# Patient Record
Sex: Female | Born: 1995
Health system: Southern US, Community
[De-identification: ages and names within clinical notes are randomized; demographics above are authoritative.]

---
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
## 2014-02-23 IMAGING — CR DG CHEST 2V
2 series · 2 of 2 positions shown · non-contrast
Comparison: 10/06/2003.

CLINICAL DATA: Productive cough.

CHEST - 2 VIEW

[view not recorded (1 of 2)]
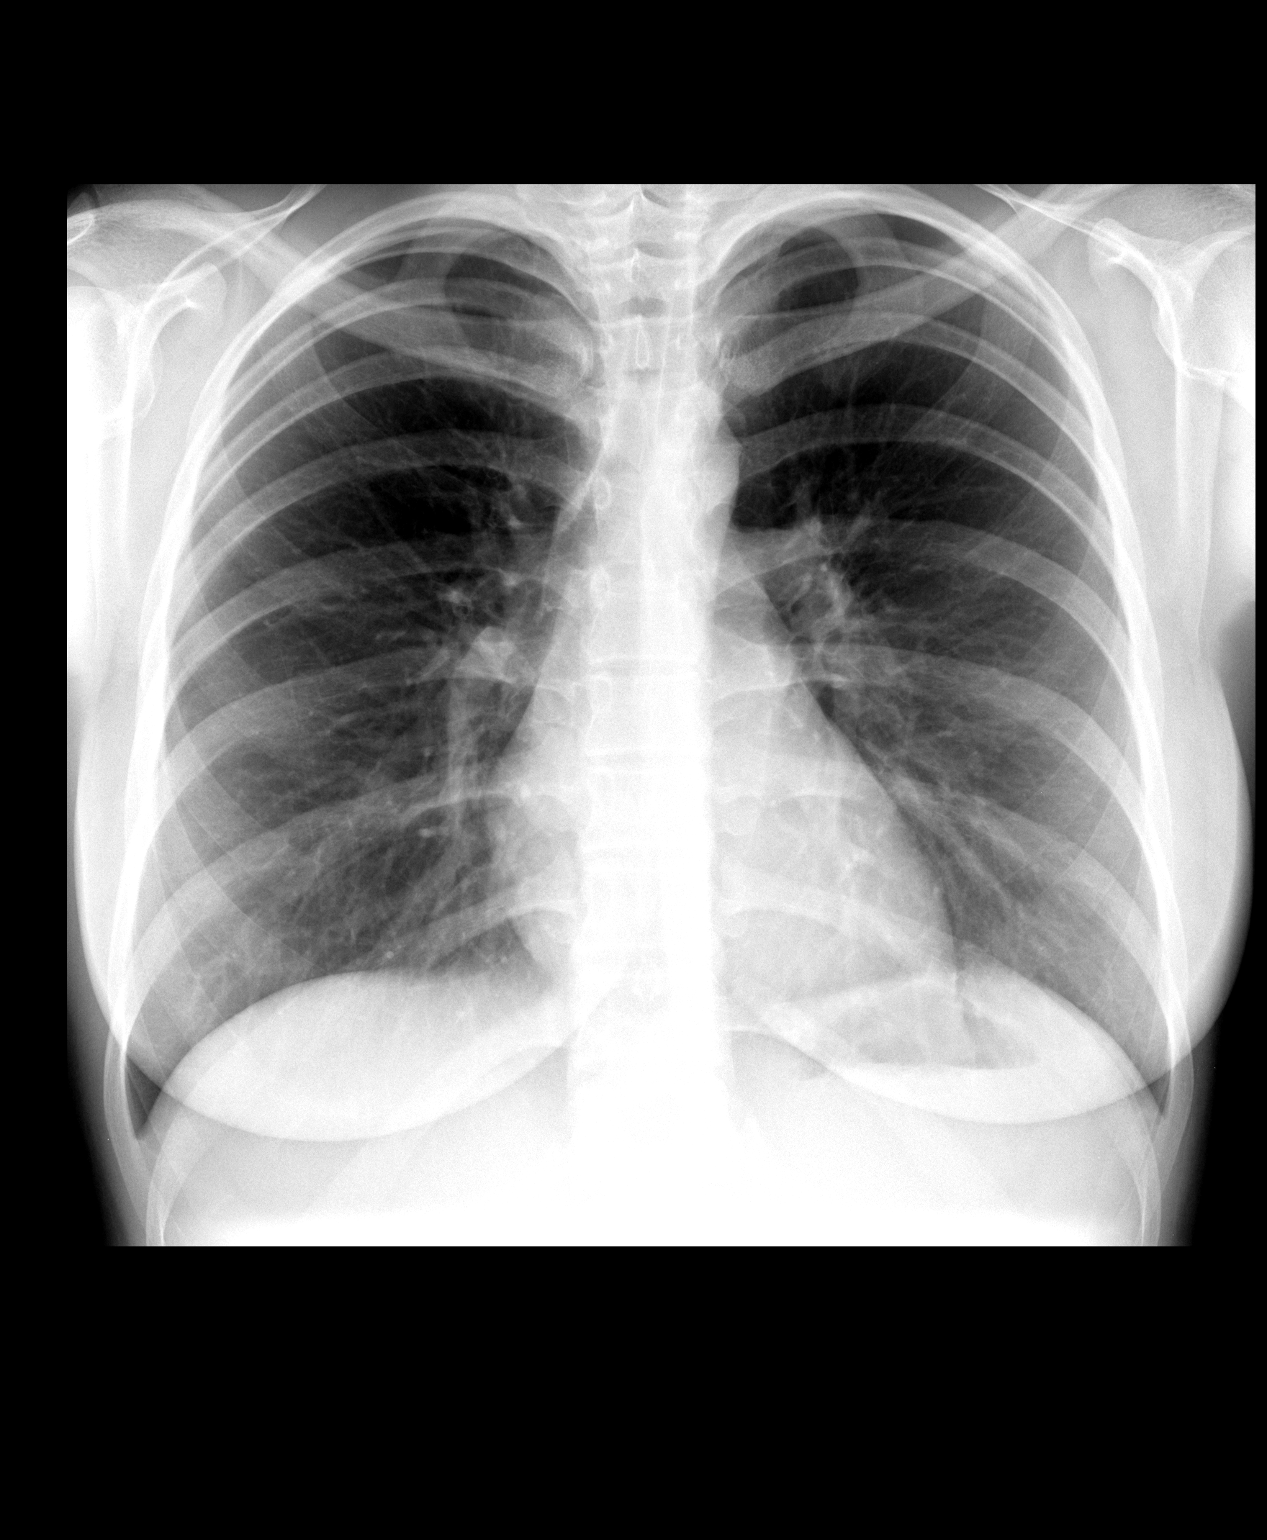

[view not recorded (2 of 2)]
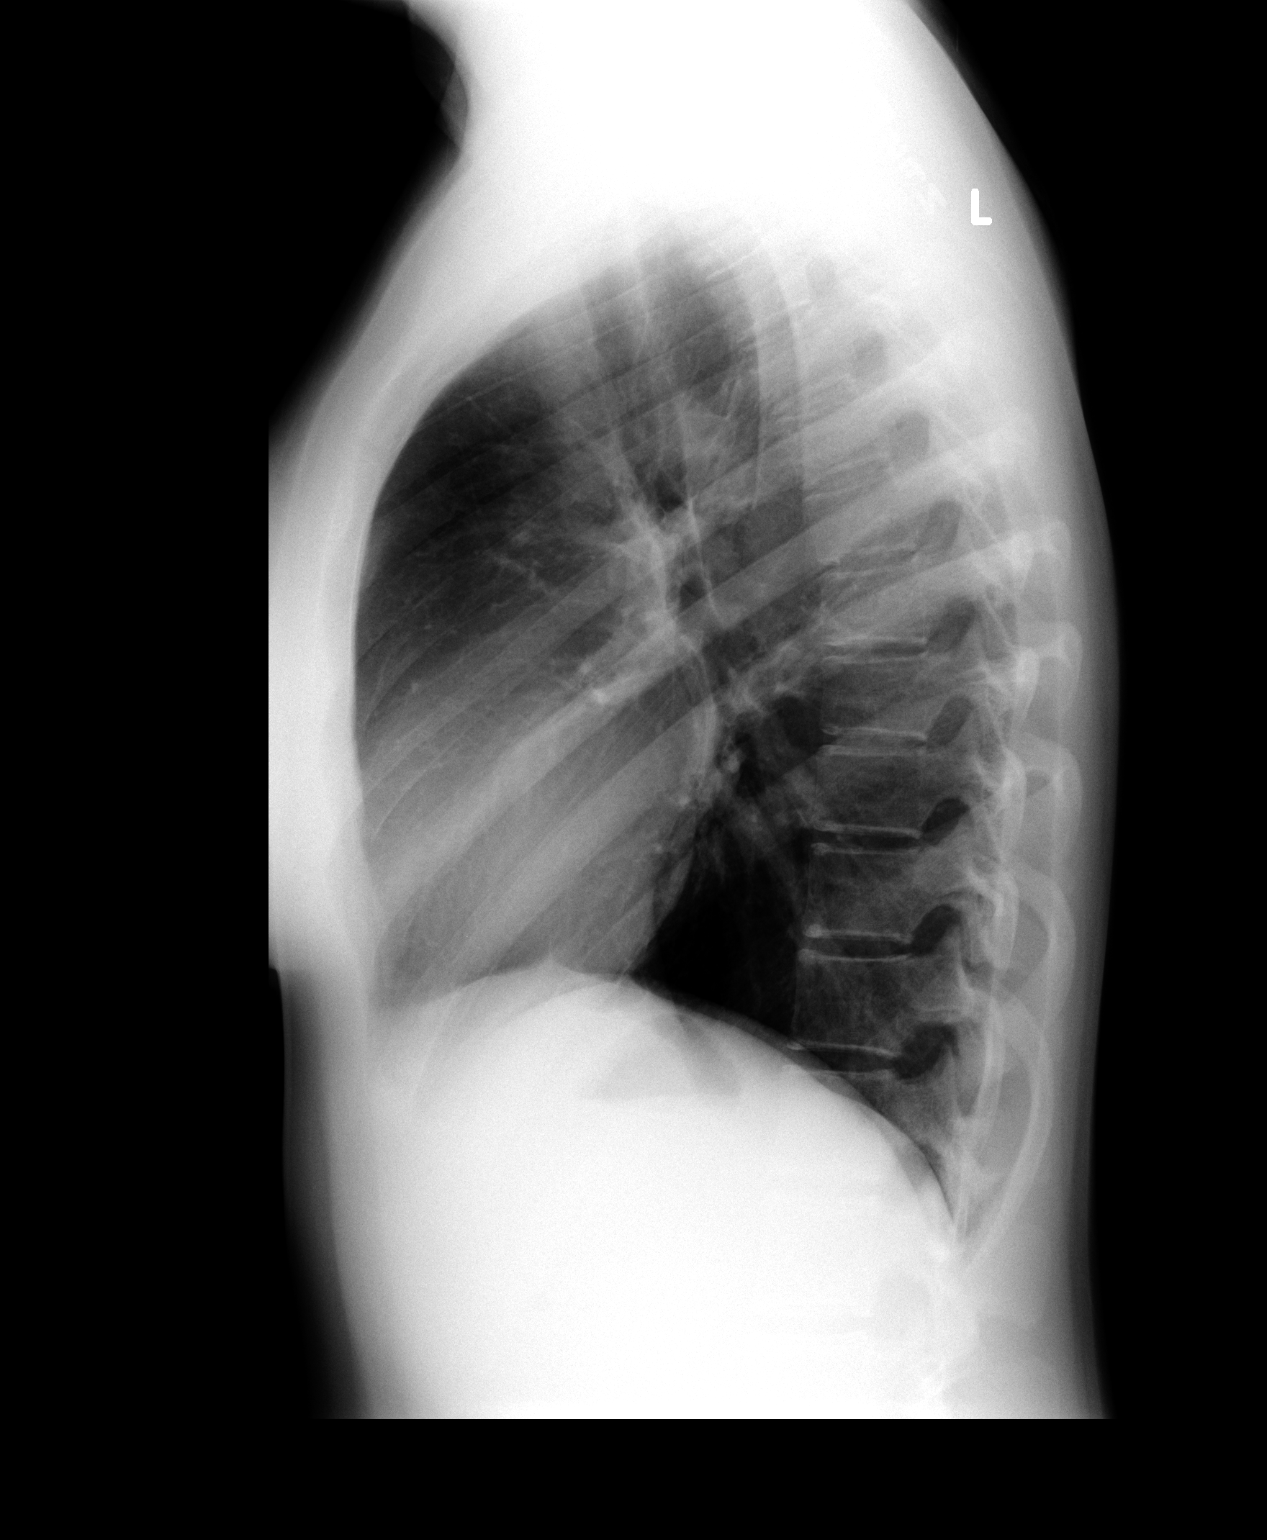

[2 of 2 positions shown; findings below may reference images not displayed]

FINDINGS: The cardiac silhouette, mediastinal and hilar contours
are within normal limits and stable. The lungs are clear.  No
pleural effusions.  The bony thorax is intact.
IMPRESSION: Normal chest x-ray.

## 2018-12-11 DIAGNOSIS — K921 Melena: Secondary | ICD-10-CM | POA: Diagnosis not present

## 2018-12-27 DIAGNOSIS — F3181 Bipolar II disorder: Secondary | ICD-10-CM | POA: Diagnosis not present

## 2018-12-27 DIAGNOSIS — F411 Generalized anxiety disorder: Secondary | ICD-10-CM | POA: Diagnosis not present

## 2018-12-27 DIAGNOSIS — F902 Attention-deficit hyperactivity disorder, combined type: Secondary | ICD-10-CM | POA: Diagnosis not present

## 2018-12-27 DIAGNOSIS — F1010 Alcohol abuse, uncomplicated: Secondary | ICD-10-CM | POA: Diagnosis not present

## 2019-01-09 DIAGNOSIS — F17200 Nicotine dependence, unspecified, uncomplicated: Secondary | ICD-10-CM | POA: Diagnosis not present

## 2019-01-09 DIAGNOSIS — S61512A Laceration without foreign body of left wrist, initial encounter: Secondary | ICD-10-CM | POA: Diagnosis not present

## 2019-01-09 DIAGNOSIS — W260XXA Contact with knife, initial encounter: Secondary | ICD-10-CM | POA: Diagnosis not present

## 2019-01-09 DIAGNOSIS — Z79899 Other long term (current) drug therapy: Secondary | ICD-10-CM | POA: Diagnosis not present

## 2019-01-09 DIAGNOSIS — Z23 Encounter for immunization: Secondary | ICD-10-CM | POA: Diagnosis not present

## 2019-01-20 DIAGNOSIS — R0781 Pleurodynia: Secondary | ICD-10-CM | POA: Diagnosis not present

## 2019-01-20 DIAGNOSIS — K625 Hemorrhage of anus and rectum: Secondary | ICD-10-CM | POA: Diagnosis not present

## 2019-01-21 DIAGNOSIS — F902 Attention-deficit hyperactivity disorder, combined type: Secondary | ICD-10-CM | POA: Diagnosis not present

## 2019-01-21 DIAGNOSIS — F1010 Alcohol abuse, uncomplicated: Secondary | ICD-10-CM | POA: Diagnosis not present

## 2019-01-21 DIAGNOSIS — F3181 Bipolar II disorder: Secondary | ICD-10-CM | POA: Diagnosis not present

## 2019-01-21 DIAGNOSIS — F411 Generalized anxiety disorder: Secondary | ICD-10-CM | POA: Diagnosis not present

## 2019-07-03 DIAGNOSIS — F411 Generalized anxiety disorder: Secondary | ICD-10-CM | POA: Diagnosis not present

## 2019-07-03 DIAGNOSIS — F3160 Bipolar disorder, current episode mixed, unspecified: Secondary | ICD-10-CM | POA: Diagnosis not present

## 2019-07-03 DIAGNOSIS — F902 Attention-deficit hyperactivity disorder, combined type: Secondary | ICD-10-CM | POA: Diagnosis not present

## 2019-07-31 DIAGNOSIS — F902 Attention-deficit hyperactivity disorder, combined type: Secondary | ICD-10-CM | POA: Diagnosis not present

## 2019-07-31 DIAGNOSIS — F1010 Alcohol abuse, uncomplicated: Secondary | ICD-10-CM | POA: Diagnosis not present

## 2019-07-31 DIAGNOSIS — F411 Generalized anxiety disorder: Secondary | ICD-10-CM | POA: Diagnosis not present

## 2019-07-31 DIAGNOSIS — F3181 Bipolar II disorder: Secondary | ICD-10-CM | POA: Diagnosis not present

## 2019-11-23 DIAGNOSIS — T300 Burn of unspecified body region, unspecified degree: Secondary | ICD-10-CM | POA: Diagnosis not present

## 2019-11-23 DIAGNOSIS — Z6827 Body mass index (BMI) 27.0-27.9, adult: Secondary | ICD-10-CM | POA: Diagnosis not present

## 2019-12-06 ENCOUNTER — Ambulatory Visit: Payer: Self-pay | Attending: Internal Medicine

## 2019-12-06 ENCOUNTER — Ambulatory Visit: Payer: Self-pay

## 2019-12-06 ENCOUNTER — Other Ambulatory Visit: Payer: Self-pay

## 2019-12-06 DIAGNOSIS — Z23 Encounter for immunization: Secondary | ICD-10-CM

## 2019-12-06 NOTE — Progress Notes (Signed)
   Covid-[facility] Vaccination [facility]  Name:  CHISTINA ROSTON    MRN: [id number] DOB: 09-12-1995  12/06/2019  Ms. Glendenning was observed post Covid-19 immunization for 15 minutes without incident. She was provided with Vaccine Information Sheet and instruction to access the V-Safe system.   Ms. Prichett was instructed to call 911 with any severe reactions post vaccine: Marland Kitchen Difficulty breathing  . Swelling of face and throat  . A fast heartbeat  . A bad rash all over body  . Dizziness and weakness

## 2020-04-08 DIAGNOSIS — J302 Other seasonal allergic rhinitis: Secondary | ICD-10-CM | POA: Diagnosis not present

## 2020-04-08 DIAGNOSIS — Z23 Encounter for immunization: Secondary | ICD-10-CM | POA: Diagnosis not present

## 2020-04-08 DIAGNOSIS — Z0000 Encounter for general adult medical examination without abnormal findings: Secondary | ICD-10-CM | POA: Diagnosis not present

## 2020-07-12 DIAGNOSIS — Z79899 Other long term (current) drug therapy: Secondary | ICD-10-CM | POA: Diagnosis not present

## 2020-07-12 DIAGNOSIS — Z0001 Encounter for general adult medical examination with abnormal findings: Secondary | ICD-10-CM | POA: Diagnosis not present

## 2020-10-16 DIAGNOSIS — F902 Attention-deficit hyperactivity disorder, combined type: Secondary | ICD-10-CM | POA: Diagnosis not present

## 2020-10-16 DIAGNOSIS — F411 Generalized anxiety disorder: Secondary | ICD-10-CM | POA: Diagnosis not present

## 2020-10-16 DIAGNOSIS — F3160 Bipolar disorder, current episode mixed, unspecified: Secondary | ICD-10-CM | POA: Diagnosis not present

## 2020-10-30 DIAGNOSIS — Z20822 Contact with and (suspected) exposure to covid-19: Secondary | ICD-10-CM | POA: Diagnosis not present

## 2020-11-01 DIAGNOSIS — F17210 Nicotine dependence, cigarettes, uncomplicated: Secondary | ICD-10-CM | POA: Diagnosis not present

## 2020-11-01 DIAGNOSIS — F3181 Bipolar II disorder: Secondary | ICD-10-CM | POA: Diagnosis not present

## 2020-11-01 DIAGNOSIS — U071 COVID-19: Secondary | ICD-10-CM | POA: Diagnosis not present

## 2020-11-04 DIAGNOSIS — U071 COVID-19: Secondary | ICD-10-CM | POA: Diagnosis not present

## 2020-12-15 DIAGNOSIS — F319 Bipolar disorder, unspecified: Secondary | ICD-10-CM | POA: Diagnosis not present

## 2020-12-20 DIAGNOSIS — F319 Bipolar disorder, unspecified: Secondary | ICD-10-CM | POA: Diagnosis not present

## 2020-12-23 DIAGNOSIS — J09X2 Influenza due to identified novel influenza A virus with other respiratory manifestations: Secondary | ICD-10-CM | POA: Diagnosis not present

## 2020-12-23 DIAGNOSIS — R5383 Other fatigue: Secondary | ICD-10-CM | POA: Diagnosis not present

## 2020-12-25 DIAGNOSIS — F3160 Bipolar disorder, current episode mixed, unspecified: Secondary | ICD-10-CM | POA: Diagnosis not present

## 2020-12-25 DIAGNOSIS — F902 Attention-deficit hyperactivity disorder, combined type: Secondary | ICD-10-CM | POA: Diagnosis not present

## 2020-12-25 DIAGNOSIS — F411 Generalized anxiety disorder: Secondary | ICD-10-CM | POA: Diagnosis not present

## 2020-12-27 DIAGNOSIS — F319 Bipolar disorder, unspecified: Secondary | ICD-10-CM | POA: Diagnosis not present

## 2021-01-06 DIAGNOSIS — F1320 Sedative, hypnotic or anxiolytic dependence, uncomplicated: Secondary | ICD-10-CM | POA: Diagnosis not present

## 2021-01-06 DIAGNOSIS — F319 Bipolar disorder, unspecified: Secondary | ICD-10-CM | POA: Diagnosis not present

## 2021-01-06 DIAGNOSIS — Z5181 Encounter for therapeutic drug level monitoring: Secondary | ICD-10-CM | POA: Diagnosis not present

## 2021-01-06 DIAGNOSIS — Z79899 Other long term (current) drug therapy: Secondary | ICD-10-CM | POA: Diagnosis not present

## 2021-01-06 DIAGNOSIS — F3160 Bipolar disorder, current episode mixed, unspecified: Secondary | ICD-10-CM | POA: Diagnosis not present

## 2021-01-10 DIAGNOSIS — Z0000 Encounter for general adult medical examination without abnormal findings: Secondary | ICD-10-CM | POA: Diagnosis not present

## 2021-01-12 DIAGNOSIS — Z113 Encounter for screening for infections with a predominantly sexual mode of transmission: Secondary | ICD-10-CM | POA: Diagnosis not present

## 2021-01-12 DIAGNOSIS — Z118 Encounter for screening for other infectious and parasitic diseases: Secondary | ICD-10-CM | POA: Diagnosis not present

## 2021-01-12 DIAGNOSIS — Z01419 Encounter for gynecological examination (general) (routine) without abnormal findings: Secondary | ICD-10-CM | POA: Diagnosis not present

## 2021-01-12 DIAGNOSIS — Z23 Encounter for immunization: Secondary | ICD-10-CM | POA: Diagnosis not present

## 2021-01-12 DIAGNOSIS — Z0000 Encounter for general adult medical examination without abnormal findings: Secondary | ICD-10-CM | POA: Diagnosis not present

## 2021-01-13 DIAGNOSIS — F319 Bipolar disorder, unspecified: Secondary | ICD-10-CM | POA: Diagnosis not present

## 2021-01-14 DIAGNOSIS — J069 Acute upper respiratory infection, unspecified: Secondary | ICD-10-CM | POA: Diagnosis not present

## 2021-01-14 DIAGNOSIS — B9689 Other specified bacterial agents as the cause of diseases classified elsewhere: Secondary | ICD-10-CM | POA: Diagnosis not present

## 2021-01-14 DIAGNOSIS — J329 Chronic sinusitis, unspecified: Secondary | ICD-10-CM | POA: Diagnosis not present

## 2021-01-20 DIAGNOSIS — F319 Bipolar disorder, unspecified: Secondary | ICD-10-CM | POA: Diagnosis not present

## 2021-01-20 DIAGNOSIS — M542 Cervicalgia: Secondary | ICD-10-CM | POA: Diagnosis not present

## 2021-01-25 DIAGNOSIS — F319 Bipolar disorder, unspecified: Secondary | ICD-10-CM | POA: Diagnosis not present

## 2021-02-08 DIAGNOSIS — F319 Bipolar disorder, unspecified: Secondary | ICD-10-CM | POA: Diagnosis not present

## 2021-02-15 DIAGNOSIS — F319 Bipolar disorder, unspecified: Secondary | ICD-10-CM | POA: Diagnosis not present

## 2021-02-15 DIAGNOSIS — J40 Bronchitis, not specified as acute or chronic: Secondary | ICD-10-CM | POA: Diagnosis not present

## 2021-02-15 DIAGNOSIS — R058 Other specified cough: Secondary | ICD-10-CM | POA: Diagnosis not present

## 2021-02-15 DIAGNOSIS — J069 Acute upper respiratory infection, unspecified: Secondary | ICD-10-CM | POA: Diagnosis not present

## 2021-02-22 DIAGNOSIS — F319 Bipolar disorder, unspecified: Secondary | ICD-10-CM | POA: Diagnosis not present

## 2021-02-26 DIAGNOSIS — F411 Generalized anxiety disorder: Secondary | ICD-10-CM | POA: Diagnosis not present

## 2021-02-26 DIAGNOSIS — F902 Attention-deficit hyperactivity disorder, combined type: Secondary | ICD-10-CM | POA: Diagnosis not present

## 2021-02-26 DIAGNOSIS — F3160 Bipolar disorder, current episode mixed, unspecified: Secondary | ICD-10-CM | POA: Diagnosis not present

## 2021-03-09 DIAGNOSIS — J309 Allergic rhinitis, unspecified: Secondary | ICD-10-CM | POA: Diagnosis not present

## 2021-03-09 DIAGNOSIS — Z720 Tobacco use: Secondary | ICD-10-CM | POA: Diagnosis not present

## 2021-03-09 DIAGNOSIS — J3489 Other specified disorders of nose and nasal sinuses: Secondary | ICD-10-CM | POA: Diagnosis not present

## 2021-03-10 DIAGNOSIS — F319 Bipolar disorder, unspecified: Secondary | ICD-10-CM | POA: Diagnosis not present

## 2021-03-28 DIAGNOSIS — F3160 Bipolar disorder, current episode mixed, unspecified: Secondary | ICD-10-CM | POA: Diagnosis not present

## 2021-03-28 DIAGNOSIS — F902 Attention-deficit hyperactivity disorder, combined type: Secondary | ICD-10-CM | POA: Diagnosis not present

## 2021-03-28 DIAGNOSIS — F411 Generalized anxiety disorder: Secondary | ICD-10-CM | POA: Diagnosis not present

## 2021-03-31 DIAGNOSIS — F319 Bipolar disorder, unspecified: Secondary | ICD-10-CM | POA: Diagnosis not present

## 2021-04-07 DIAGNOSIS — F3160 Bipolar disorder, current episode mixed, unspecified: Secondary | ICD-10-CM | POA: Diagnosis not present

## 2021-04-07 DIAGNOSIS — F902 Attention-deficit hyperactivity disorder, combined type: Secondary | ICD-10-CM | POA: Diagnosis not present

## 2021-04-07 DIAGNOSIS — F411 Generalized anxiety disorder: Secondary | ICD-10-CM | POA: Diagnosis not present

## 2021-04-12 DIAGNOSIS — F319 Bipolar disorder, unspecified: Secondary | ICD-10-CM | POA: Diagnosis not present

## 2021-04-28 DIAGNOSIS — F319 Bipolar disorder, unspecified: Secondary | ICD-10-CM | POA: Diagnosis not present

## 2021-05-12 DIAGNOSIS — F319 Bipolar disorder, unspecified: Secondary | ICD-10-CM | POA: Diagnosis not present

## 2021-05-24 DIAGNOSIS — F319 Bipolar disorder, unspecified: Secondary | ICD-10-CM | POA: Diagnosis not present

## 2021-06-20 DIAGNOSIS — F319 Bipolar disorder, unspecified: Secondary | ICD-10-CM | POA: Diagnosis not present

## 2021-06-23 ENCOUNTER — Ambulatory Visit (HOSPITAL_COMMUNITY)
Admission: EM | Admit: 2021-06-23 | Discharge: 2021-06-23 | Disposition: A | Discharge status: Home / Self Care | Payer: 59 | Attending: Psychiatry | Admitting: Psychiatry

## 2021-06-23 DIAGNOSIS — F319 Bipolar disorder, unspecified: Secondary | ICD-10-CM | POA: Insufficient documentation

## 2021-06-23 DIAGNOSIS — Z20822 Contact with and (suspected) exposure to covid-19: Secondary | ICD-10-CM | POA: Insufficient documentation

## 2021-06-23 DIAGNOSIS — R45850 Homicidal ideations: Secondary | ICD-10-CM

## 2021-06-23 DIAGNOSIS — Z9151 Personal history of suicidal behavior: Secondary | ICD-10-CM | POA: Diagnosis not present

## 2021-06-23 DIAGNOSIS — R45851 Suicidal ideations: Secondary | ICD-10-CM | POA: Diagnosis present

## 2021-06-23 DIAGNOSIS — T401X2A Poisoning by heroin, intentional self-harm, initial encounter: Secondary | ICD-10-CM | POA: Diagnosis not present

## 2021-06-23 DIAGNOSIS — Y929 Unspecified place or not applicable: Secondary | ICD-10-CM | POA: Insufficient documentation

## 2021-06-23 LAB — CBC WITH DIFFERENTIAL/PLATELET
Abs Immature Granulocytes: 0.01 10*3/uL (ref 0.00–0.07)
Basophils Absolute: 0 10*3/uL (ref 0.0–0.1)
Basophils Relative: 1 %
Eosinophils Absolute: 0.2 10*3/uL (ref 0.0–0.5)
Eosinophils Relative: 3 %
HCT: 39.6 % (ref 36.0–46.0)
Hemoglobin: 12.7 g/dL (ref 12.0–15.0)
Immature Granulocytes: 0 %
Lymphocytes Relative: 27 %
Lymphs Abs: 1.6 10*3/uL (ref 0.7–4.0)
MCH: 30.7 pg (ref 26.0–34.0)
MCHC: 32.1 g/dL (ref 30.0–36.0)
MCV: 95.7 fL (ref 80.0–100.0)
Monocytes Absolute: 0.6 10*3/uL (ref 0.1–1.0)
Monocytes Relative: 10 %
Neutro Abs: 3.6 10*3/uL (ref 1.7–7.7)
Neutrophils Relative %: 59 %
Platelets: 258 10*3/uL (ref 150–400)
RBC: 4.14 MIL/uL (ref 3.87–5.11)
RDW: 13.2 % (ref 11.5–15.5)
WBC: 6.1 10*3/uL (ref 4.0–10.5)
nRBC: 0 % (ref 0.0–0.2)

## 2021-06-23 LAB — POCT URINE DRUG SCREEN - MANUAL ENTRY (I-SCREEN)
POC Amphetamine UR: NOT DETECTED
POC Buprenorphine (BUP): NOT DETECTED
POC Cocaine UR: NOT DETECTED
POC Marijuana UR: NOT DETECTED
POC Methadone UR: NOT DETECTED
POC Methamphetamine UR: NOT DETECTED
POC Morphine: NOT DETECTED
POC Oxazepam (BZO): NOT DETECTED
POC Oxycodone UR: NOT DETECTED
POC Secobarbital (BAR): NOT DETECTED

## 2021-06-23 LAB — COMPREHENSIVE METABOLIC PANEL
ALT: 15 U/L (ref 0–44)
AST: 19 U/L (ref 15–41)
Albumin: 4.8 g/dL (ref 3.5–5.0)
Alkaline Phosphatase: 46 U/L (ref 38–126)
Anion gap: 7 (ref 5–15)
BUN: 11 mg/dL (ref 6–20)
CO2: 24 mmol/L (ref 22–32)
Calcium: 10.5 mg/dL — ABNORMAL HIGH (ref 8.9–10.3)
Chloride: 109 mmol/L (ref 98–111)
Creatinine, Ser: 0.8 mg/dL (ref 0.44–1.00)
GFR, Estimated: >60 mL/min (ref >60)
Glucose, Bld: 69 mg/dL — ABNORMAL LOW (ref 70–99)
Potassium: 3.5 mmol/L (ref 3.5–5.1)
Sodium: 140 mmol/L (ref 135–145)
Total Bilirubin: 0.8 mg/dL (ref 0.3–1.2)
Total Protein: 7.4 g/dL (ref 6.5–8.1)

## 2021-06-23 LAB — LIPID PANEL
Cholesterol: 205 mg/dL — ABNORMAL HIGH (ref 0–200)
HDL: 72 mg/dL (ref >40)
LDL Cholesterol: 120 mg/dL — ABNORMAL HIGH (ref 0–99)
Total CHOL/HDL Ratio: 2.8 RATIO
Triglycerides: 67 mg/dL (ref <150)
VLDL: 13 mg/dL (ref 0–40)

## 2021-06-23 LAB — RESP PANEL BY RT-PCR (FLU A&B, COVID) ARPGX2
Influenza A by PCR: NEGATIVE
Influenza B by PCR: NEGATIVE
SARS Coronavirus 2 by RT PCR: NEGATIVE

## 2021-06-23 LAB — POCT PREGNANCY, URINE: Preg Test, Ur: NEGATIVE

## 2021-06-23 LAB — ETHANOL: Alcohol, Ethyl (B): <10 mg/dL (ref <10)

## 2021-06-23 LAB — HEMOGLOBIN A1C
Hgb A1c MFr Bld: 4.8 % (ref 4.8–5.6)
Mean Plasma Glucose: 91.06 mg/dL

## 2021-06-23 LAB — TSH: TSH: 1.853 u[IU]/mL (ref 0.350–4.500)

## 2021-06-23 LAB — POC SARS CORONAVIRUS 2 AG: SARSCOV2ONAVIRUS 2 AG: NEGATIVE

## 2021-06-23 MED ORDER — ACETAMINOPHEN 325 MG PO TABS: 650.0000 mg | ORAL_TABLET | Freq: Four times a day (QID) | ORAL | Status: DC | PRN

## 2021-06-23 MED ORDER — MAGNESIUM HYDROXIDE 400 MG/5ML PO SUSP: 30.0000 mL | Freq: Every day | ORAL | Status: DC | PRN

## 2021-06-23 MED ORDER — ALUM & MAG HYDROXIDE-SIMETH 200-200-20 MG/5ML PO SUSP: 30.0000 mL | ORAL | Status: DC | PRN

## 2021-06-23 NOTE — BH Assessment (Signed)
Comprehensive Clinical Assessment (CCA) Note  06/23/2021 Emma Holland [id number]  Disposition: Per Emma Holland admission [facility] is recommended for safety and stabilization with AM reassessment by psychiatry.   The patient demonstrates the following risk factors for suicide: Chronic risk factors for suicide include: psychiatric disorder of Bipolar Disorder, GAD and substance use disorder. Acute risk factors for suicide include: family or marital conflict and loss (financial, interpersonal, professional). Protective factors for this patient include: positive social support, responsibility to others (children, family), and coping skills. Considering these factors, the overall suicide risk at this point appears to be moderate. Patient is appropriate for outpatient follow up once stabilized.   Patient is a 26 year old female with a history of Bipolar and GAD who [facility].  Patient  presents accompanied by mom and boyfriend, with complaint of suicidal ideation with a plans of  overdosing on heroin or attempting by alcohol poisoning. Patient reports rapid and intense mood swings, impulsive behaviors and anger that turns into rage that has been causing her problems for about a week or two. Patient states she was at work today and began feeling like she wanted to die.  She reports that she and her boyfriend had been having issues lately, primarily related to struggles managing their "open" relationship.  Patient is uncertain if they will be able to hold onto their relationship of 4 years.  Patient saw her psychiatrist, Emma Holland of [facility] for med management today, however they waited to adjust meds due to concerns patient has recently been experiencing significant mood instability. Patient admits to HI, denying a plan, but having passive thoughts about harming a co-worker.  She denies plan or intent, stating she has  some relief when she thinks of him being hurt.   Patient denies AVH and current alcohol/substance use. She last drank 1.5 years ago and last used vyvanse 26 days ago.  Patient states she thought about her suicide plan for over an hour while at work.  She currently denies SI, however she has difficulty contracting for safety stating, "I just don't know.".    Patient's mother and boyfriend were informed of recommendation, as patient requested.  They are in agreement with recommendation for observation due to their safety concerns.    Chief Complaint:  Chief Complaint  Patient presents with   Suicidal   Visit Diagnosis: Bipolar I Disorder                             GAD    [facility]  C-SSRS RISK CATEGORY High Risk      CCA Screening, Triage and Referral (STR)  Patient Reported Information How did you hear about us? Family/Friend  What Is the Reason for Your Visit/Call Today? [facility] by mom and boyfriend, with complaint of suicidal ideation with a plan to overdose on heroine or alcohol poisoning. Pt reports rapid and intense mood swings, impulsive behaviors and anger that turns into rage that has been causing her problems for about a week or two. Pt states she was at work today and began feeling like she wanted to die. Pt states that she and her boyfriend had been having issues as well as of lately. Pt reports seeing her psychiatrist today and felt that she was destabilizing. Pt has hx of bipolar disorder and  Generalized Anxiety Disorder. Pt states having thoughts of wanting to hurt one of her coworkers, with no plan, but it relieved stress for her to think about them being hurt. Pt is cooperative and pleasant during triage process, but with flat affect. Pt denies AVH and current alcohol/substance use.  How Long Has This Been Causing You Problems? 1 wk - 1 month  What Do You Feel Would Help You the Most  Today? Treatment for Depression or other mood problem   Have You Recently Had Any Thoughts About Hurting Yourself? Yes  Are You Planning to Commit Suicide/Harm Yourself At This time? Yes   Have you Recently Had Thoughts About Hurting Someone [name]? Yes  Are You Planning to Harm Someone at This Time? No  Explanation: No data recorded  Have You Used Any Alcohol or Drugs in the Past 24 Hours? No  How Long Ago Did You Use Drugs or Alcohol? No data recorded What Did You Use and How Much? No data recorded  Do You Currently Have a Therapist/Psychiatrist? Yes  Name of Therapist/Psychiatrist: Kerin Holland of [facility] for med management - Emma Holland for therapy   Have You Been Recently Discharged From Any Public relations account executive or Programs? No  Explanation of Discharge From Practice/Program: No data recorded    CCA Screening Triage Referral Assessment Type of Contact: Face-to-Face  Telemedicine Service Delivery:   Is this Initial or Reassessment? No data recorded Date Telepsych consult ordered in [facility]:  No data recorded Time Telepsych consult ordered in [facility]:  No data recorded Location of Assessment: [facility]  Provider Location: [facility] Assessment Services   Collateral Involvement: N/A - Per pt request, mother and boyfriend informed of obs amission   Does Patient Have a Automotive engineer Guardian? No data recorded Name and Contact of Legal Guardian: No data recorded If Minor and Not Living with Parent(s), Who has Custody? No data recorded Is CPS involved or ever been involved? Never  Is APS involved or ever been involved? Never   Patient Determined To Be At Risk for Harm To Self or Others Based on Review of Patient Reported Information or Presenting Complaint? Yes, for Self-Harm  Method: No data recorded Availability of Means: No data recorded Intent: No data recorded Notification Required: No data recorded Additional Information  for Danger to Others Potential: No data recorded Additional Comments for Danger to Others Potential: No data recorded Are There Guns or Other Weapons in Your Home? No data recorded Types of Guns/Weapons: No data recorded Are These Weapons Safely Secured?                            No data recorded Who Could Verify You Are Able To Have These Secured: No data recorded Do You Have any Outstanding Charges, Pending Court Dates, Parole/Probation? No data recorded Contacted To Inform of Risk of Harm To Self or Others: Family/Significant Other:    Does Patient Present under Involuntary Commitment? No  IVC Papers Initial File Date: No data recorded  [place] of Residence: [place]   Patient Currently Receiving the Following Services: Individual Therapy; Medication Management   Determination of Need: Emergent (2 hours)   Options For Referral: Medication Management; [facility] Urgent Care; Inpatient Hospitalization; Outpatient Therapy     CCA Biopsychosocial Patient Reported Schizophrenia/Schizoaffective Diagnosis in Past: No   Strengths: Engaged in outpt treatment, has support   Mental Health Symptoms Depression:   Difficulty  Concentrating; Hopelessness; Worthlessness   Duration of Depressive symptoms:  Duration of Depressive Symptoms: Greater than two weeks   Mania:   Racing thoughts; Recklessness   Anxiety:    Tension; Worrying   Psychosis:   None   Duration of Psychotic symptoms:    Trauma:   None   Obsessions:   None   Compulsions:   None   Inattention:   N/A   Hyperactivity/Impulsivity:   N/A   Oppositional/Defiant Behaviors:   N/A   Emotional Irregularity:   Mood lability   Other Mood/Personality Symptoms:  No data recorded   Mental Status Exam Appearance and self-care  Stature:   Average   Weight:   Average weight   Clothing:   Casual   Grooming:   Normal   Cosmetic use:   Age appropriate   Posture/gait:   Normal   Motor activity:    Not Remarkable   Sensorium  Attention:   Normal   Concentration:   Normal   Orientation:   X5   Recall/memory:   Normal   Affect and Mood  Affect:   Depressed   Mood:   Depressed; Anxious   Relating  Eye contact:   Normal   Facial expression:   Responsive; Sad; Anxious   Attitude toward examiner:   Cooperative   Thought and Language  Speech flow:  Clear and Coherent   Thought content:   Appropriate to Mood and Circumstances   Preoccupation:   None   Hallucinations:   None   Organization:  No data recorded  [employer] of Knowledge:   Average   Intelligence:   Average   Abstraction:   Normal   Judgement:   Fair   Reality Testing:   Adequate   Insight:   Gaps   Decision Making:   Impulsive; Vacilates; Normal   Social Functioning  Social Maturity:   Responsible   Social Judgement:   Normal   Stress  Stressors:   Relationship   Coping Ability:   Exhausted; Overwhelmed   Skill Deficits:   Decision making; Interpersonal; Responsibility   Supports:   Family; Friends/Service system     Religion: Religion/Spirituality Are You A Religious Person?: No  Leisure/Recreation: Leisure / Recreation Do You Have Hobbies?: No  Exercise/Diet: Exercise/Diet Do You Exercise?: No Have You Gained or Lost A Significant Amount of Weight in the Past Six Months?: No Do You Follow a Special Diet?: No Do You Have Any Trouble Sleeping?: Yes Explanation of Sleeping Difficulties: 1-2 x/wk sleeps 8 hours - the other nights she sleeps 3 hours   CCA Employment/Education Employment/Work Situation: Employment / Work Situation Employment Situation: Employed Work Stressors: Bothered by one of her co-workers esp when his carelessness affectes her. Patient's Job has Been Impacted by Current Illness: Yes Describe how Patient's Job has Been Impacted: Patient states she tries to cope with the co-worker, but she struggles  sometimes. Has Patient ever Been in the [employer]?: No  Education: Education Is Patient Currently Attending School?: No Last Grade Completed: 12 Did You Attend College?: No Did You Have An Individualized Education Program (IIEP): No Did You Have Any Difficulty At School?: No Patient's Education Has Been Impacted by Current Illness: No   CCA Family/Childhood History Family and Relationship History: Family history Marital status: Long term relationship Long term relationship, how long?: 4 yrs What types of issues is patient dealing with in the relationship?: Patient and boyfriend are struggling to manage their "open" relationship.  Patient admits  she has ot been honest about recent connections, but afraid to tell bf. Additional relationship information: NA Does patient have children?: No  Childhood History:  Childhood History By whom was/is the patient raised?: Both parents Did patient suffer any verbal/emotional/physical/sexual abuse as a child?: No Did patient suffer from severe childhood neglect?: No Has patient ever been sexually abused/assaulted/raped as an adolescent or adult?: No Was the patient ever a victim of a crime or a disaster?: No Witnessed domestic violence?: No Has patient been affected by domestic violence as an adult?: No  Child/Adolescent Assessment:     CCA Substance Use Alcohol/Drug Use: Alcohol / Drug Use Pain Medications: None Prescriptions: "Admits to misuse of Rx vyvanse up unitl 25 days ago Over the Counter: None Longest period of sobriety (when/how long): Last ETOH use was 1.5 yrs ago and last vyvanse use before returning to [facility] was 26 days ago Negative Consequences of Use: Personal relationships                         ASAM's:  Six Dimensions of Multidimensional Assessment  Dimension 1:  Acute Intoxication and/or Withdrawal Potential:      Dimension 2:  Biomedical Conditions and Complications:      Dimension 3:  Emotional,  Behavioral, or Cognitive Conditions and Complications:     Dimension 4:  Readiness to Change:     Dimension 5:  Relapse, Continued use, or Continued Problem Potential:     Dimension 6:  Recovery/Living Environment:     ASAM Severity Score:    ASAM Recommended Level of Treatment:     Substance use Disorder (SUD)    Recommendations for Services/Supports/Treatments:    Discharge Disposition:    DSM5 Diagnoses: There are no problems to display for this patient.    Referrals to Alternative Service(s): Referred to Alternative Service(s):   Place:   Date:   Time:    Referred to Alternative Service(s):   Place:   Date:   Time:    Referred to Alternative Service(s):   Place:   Date:   Time:    Referred to Alternative Service(s):   Place:   Date:   Time:     Yetta Glassman, [facility]

## 2021-06-23 NOTE — ED Provider Notes (Addendum)
FBC/OBS ASAP Discharge Summary  Date and Time: 06/23/2021 12:24 PM  Name: Emma Holland  MRN:  [id number]   Discharge Diagnoses:  Final diagnoses:  Suicidal ideation  Homicidal thoughts    Subjective: Patient states, "I feel a lot better."  She states that she has been having difficulty with her boyfriend and feels guilty about it. She states that they have been having intense discussion which is her main trigger. She states that she experienced suicidal ideations while at work yesterday. She states that she is no longer suicidal and does not want to kill herself. She verbally contracts for safety to return home. She identifies protective factors as family support from her parents, grandmother, and her brother. She states that she does not have access to guns in the home. She states that she works at [employer]. She denies past suicide attempts. She denies a family history of completed suicide attempts. She states that she receives [facility]. She states that she has an upcoming therapy appointment on Monday, 06/27/21. She denies HI and states that she does not want to kill her co-worker but "would be happier if he was dead."   Stay Summary: Emma Holland,  26 y.o female presented to [facility], accompanied by her mother and her current boyfriend for suicide ideation. Patient reported that she was at work tonight and had suicidal thoughts with intention to overdose on heroin or alcohol poisoning. Patient was admitted to the [facility] continuous assessment unit for overnight observation.  Labs obtained included EKG, CBC, CMP, BAL, A1c, lipid panel, TSH, urine pregnancy, and COVID. UDS was negative.  BAL was negative. Patient observed on the unit without any disruptive, aggressive, psychotic, or self-harm behaviors. Patient reevaluated on 06/23/2021 and denies SI/HI/AVH. Patient verbally contracts for safety. Safety planning completed with the  patient's boyfriend/fianc, Wallene Huh. He states that the patient does not have access to any weapons in the home including guns, he states that he will lock up knives in the home and oversees medications. He denies any safety concerns with the patient discharging and returning home. He was advised that if the patient's symptoms worsen to bring the patient back to the [facility], call 911, or present to the closest emergency department.  Total Time spent with patient: 20 minutes  Past Psychiatric History: History of bipolar, substance use and ADHD. Past Medical History: No past medical history on file. No past surgical history on file. Family History: No family history on file. Family Psychiatric History: No history reported. Social History:  Social History   Substance and Sexual Activity  Alcohol Use None     Social History   Substance and Sexual Activity  Drug Use Not on file    Social History   Socioeconomic History   Marital status: Single    Spouse name: Not on file   Number of children: Not on file   Years of education: Not on file   Highest education level: Not on file  Occupational History   Not on file  Tobacco Use   Smoking status: Not on file   Smokeless tobacco: Not on file  Substance and Sexual Activity   Alcohol use: Not on file   Drug use: Not on file   Sexual activity: Not on file  Other Topics Concern   Not on file  Social History Narrative   Not on file   Social Determinants of Health   Financial Resource Strain: Not on file  Food Insecurity: Not on file  Transportation Needs: Not on file  Physical Activity: Not on file  Stress: Not on file  Social Connections: Not on file   SDOH:  SDOH Screenings   Alcohol Screen: Not on file  Depression (PHQ2-9): Not on file  Financial Resource Strain: Not on file  Food Insecurity: Not on file  Housing: Not on file  Physical Activity: Not on file  Social Connections: Not on file  Stress: Not on file   Tobacco Use: Not on file  Transportation Needs: Not on file    Tobacco Cessation:  N/A, patient does not currently use tobacco products  Current Medications:  Current Facility-Administered Medications  Medication Dose Route Frequency Provider Last Rate Last Admin   acetaminophen (TYLENOL) tablet 650 mg  650 mg Oral Q6H PRN Sindy GuadeloupeWilliams, Roy, NP       alum & mag hydroxide-simeth (MAALOX/MYLANTA) 200-200-20 MG/5ML suspension 30 mL  30 mL Oral Q4H PRN Sindy GuadeloupeWilliams, Roy, NP       magnesium hydroxide (MILK OF MAGNESIA) suspension 30 mL  30 mL Oral Daily PRN Sindy GuadeloupeWilliams, Roy, NP       No current outpatient medications on file.    PTA Medications: (Not in a hospital admission)   Musculoskeletal  Strength & Muscle Tone: within normal limits Gait & Station: normal Patient leans: N/A  Psychiatric Specialty Exam  Presentation  General Appearance: Casual  Eye Contact:Good  Speech:Clear and Coherent  Speech Volume:Normal  Handedness:Ambidextrous   Mood and Affect  Mood:Anxious; Depressed; Labile  Affect:Depressed; Flat   Thought Process  Thought Processes:Coherent  Descriptions of Associations:Circumstantial  Orientation:Full (Time, Place and Person)  Thought Content:Abstract Reasoning  Diagnosis of Schizophrenia or Schizoaffective disorder in past: No    Hallucinations:Hallucinations: None  Ideas of Reference:None  Suicidal Thoughts:Suicidal Thoughts: Yes, Active SI Active Intent and/or Plan: With Plan  Homicidal Thoughts:Homicidal Thoughts: Yes, Passive HI Passive Intent and/or Plan: Without Intent   Sensorium  Memory:Immediate Fair  Judgment:Poor  Insight:Poor   Executive Functions  Concentration:Poor  Attention Span:Fair  Recall:Fair  Fund of Knowledge:Fair  Language:Fair   Psychomotor Activity  Psychomotor Activity:Psychomotor Activity: Normal   Assets  Assets:No data recorded  Sleep  Sleep:Sleep: Fair   Nutritional Assessment (For OBS and  FBC admissions only) Has the patient had a weight loss or gain of 10 pounds or more in the last 3 months?: No Has the patient had a decrease in food intake/or appetite?: No Does the patient have dental problems?: No Does the patient have eating habits or behaviors that may be indicators of an eating disorder including binging or inducing vomiting?: No Has the patient recently lost weight without trying?: 0    Physical Exam  Physical Exam HENT:     Head: Normocephalic.     Nose: Nose normal.  Eyes:     Conjunctiva/sclera: Conjunctivae normal.  Cardiovascular:     Rate and Rhythm: Normal rate.  Pulmonary:     Effort: Pulmonary effort is normal.  Musculoskeletal:        General: Normal range of motion.  Neurological:     Mental Status: She is alert and oriented to person, place, and time.   Review of Systems  Constitutional: Negative.   HENT: Negative.    Eyes: Negative.   Respiratory: Negative.    Cardiovascular: Negative.   Gastrointestinal: Negative.   Genitourinary: Negative.   Musculoskeletal: Negative.   Skin: Negative.   Neurological: Negative.   Endo/Heme/Allergies: Negative.   Blood pressure 110/66, pulse 86,  temperature 99 F (37.2 C), temperature source Oral, resp. rate 18, SpO2 100 %. There is no height or weight on file to calculate BMI.  Demographic Factors:  Adolescent or young adult and Caucasian  Loss Factors: NA  Historical Factors: NA  Risk Reduction Factors:   Sense of responsibility to family, Employed, Living with another person, especially a relative, and Positive social support  Continued Clinical Symptoms:  Previous Psychiatric Diagnoses and Treatments  Cognitive Features That Contribute To Risk:  None    Suicide Risk:  Minimal: No identifiable suicidal ideation.  Patients presenting with no risk factors but with morbid ruminations; may be classified as minimal risk based on the severity of the depressive symptoms  Plan Of  Care/Follow-up recommendations:  Activity:  as tolerated  Discharge recommendations:  Patient is to take medications as prescribed. Follow up with your outpatient provider at [facility] for a follow up appointment for medication management. Follow up with your outpatient therapist at [facility] on Monday, recommended increasing therapy sessions to weekly.  Please follow up with your primary care provider for all medical related needs.   Therapy: We recommend that patient participate in individual therapy to address mental health concerns.  Medications: The parent/guardian is to contact a medical professional and/or outpatient provider to address any new side effects that develop. Parent/guardian should update outpatient providers of any new medications and/or medication changes.   Safety:  The patient should abstain from use of illicit substances/drugs and abuse of any medications. If symptoms worsen or do not continue to improve or if the patient becomes actively suicidal or homicidal then it is recommended that the patient return to the closest hospital emergency department, the [facility], or call 911 for further evaluation and treatment. National Suicide Prevention Lifeline 1-800-SUICIDE or (225)464-4078.  About 988 988 offers 24/7 access to trained crisis counselors who can help people experiencing mental health-related distress. People can call or text 988 or chat 988lifeline.org for themselves or if they are worried about a loved one who may need crisis support.    Disposition: Discharge to self/home. Patient's boyfrined Wallene Huh picked the patient up from the facility.  Layla Barter, NP 06/23/2021, 12:24 PM

## 2021-06-23 NOTE — ED Triage Notes (Signed)
Pt presents to [facility] accompanied by mom and boyfriend, with complaint of suicidal ideation with a plan to overdose on heroine or alcohol poisoning. Pt reports rapid and intense mood swings, impulsive behaviors and anger that turns into rage that has been causing her problems for about a week or two. Pt states she was at work today and began feeling like she wanted to die. Pt states that she and her boyfriend had been having issues as well as of lately. Pt reports seeing her psychiatrist today and felt that she was destabilizing. Pt has hx of bipolar disorder and Generalized Anxiety Disorder. Pt states having thoughts of wanting to hurt one of her coworkers, with no plan, but it relieved stress for her to think about them being hurt. Pt is cooperative and pleasant during triage process, but with flat affect. Pt denies AVH and current alcohol/substance use. Pt is emergent.

## 2021-06-23 NOTE — ED Notes (Signed)
Patient to unit. Endorses SI - denies HI and AVH - no c/o at this time - will monitor for safety

## 2021-06-23 NOTE — Discharge Instructions (Signed)
Discharge recommendations:  Patient is to take medications as prescribed. Follow up with your outpatient provider at [facility] for a follow up appointment for medication management. Follow up with your outpatient therapist at [facility] on Monday, recommended increasing therapy sessions to weekly.  Please follow up with your primary care provider for all medical related needs.   Therapy: We recommend that patient participate in individual therapy to address mental health concerns.  Medications: The parent/guardian is to contact a medical professional and/or outpatient provider to address any new side effects that develop. Parent/guardian should update outpatient providers of any new medications and/or medication changes.   Safety:  The patient should abstain from use of illicit substances/drugs and abuse of any medications. If symptoms worsen or do not continue to improve or if the patient becomes actively suicidal or homicidal then it is recommended that the patient return to the closest hospital emergency department, the [facility], or call 911 for further evaluation and treatment. National Suicide Prevention Lifeline 1-800-SUICIDE or 972-042-8678.  About 988 988 offers 24/7 access to trained crisis counselors who can help people experiencing mental health-related distress. People can call or text 988 or chat 988lifeline.org for themselves or if they are worried about a loved one who may need crisis support.

## 2021-06-23 NOTE — ED Notes (Signed)
Discharge instructions provided and Pt stated understanding. Pt alert, orient and ambulatory prior to d/c from facility. Personal belongings returned. Pt escorted to the front lobby to d/c from facility. Safety maintained.   

## 2021-06-23 NOTE — ED Provider Notes (Signed)
[facility] Continuous Assessment Admission H&P  Date: 06/23/21 Patient Name: CHAZLYN CUDE MRN: [id number] Chief Complaint:  Chief Complaint  Patient presents with   Suicidal      Diagnoses:  Final diagnoses:  Suicidal ideation  Homicidal thoughts    HPI: Nihal Doan,  26 y.o female presented to [facility], accompanied by her mother and her current boyfriend for suicide ideation.  According to patient she was at work tonight and had suicidal thoughts with intention to overdose on heroin or alcohol poisoning.  NP asked patient if she had access to heroin patient stated she would have contacted her ex boyfriend to get heroin from him.  Patient does have a history of prior suicide attempts, ADHD, bipolar disorder and substance use disorder.    Patient currently sees a psychiatrist and a therapist at [facility].   Patient currently takes the following medication, Cogentin, lithium, Depakote, clonidine, Abilify.  According to patient she was abusing Vyvanse and so her psychiatrist told her to stop taking Vyvanse. According to patient her stressors include arrangement she had with her boyfriend about monogamy but she was not forthcoming in the relationship.  According to patient she last saw her psychiatrist yesterday but she did not have suicidal ideation when she saw her psychiatrist.  The suicide ideation occur when she went to work last night. According to the patient she last used marijuana 26 days ago that is the same time she overdosed on Vyvanse.  Patient denies alcohol use.   Copied from triage note Pt presents to [facility] accompanied by mom and boyfriend, with complaint of suicidal ideation with a plan to overdose on heroine or alcohol poisoning. Pt reports rapid and intense mood swings, impulsive behaviors and anger that turns into rage that has been causing her problems for about a week or two. Pt states she was at work today and began feeling like she wanted to die. Pt states that  she and her boyfriend had been having issues as well as of lately. Pt reports seeing her psychiatrist today and felt that she was destabilizing. Pt has hx of bipolar disorder and Generalized Anxiety Disorder. Pt states having thoughts of wanting to hurt one of her coworkers, with no plan, but it relieved stress for her to think about them being hurt. Pt is cooperative and pleasant during triage process, but with flat affect. Pt denies AVH and current alcohol/substance use.  Observation of patient, she is alert and oriented x4, speech is clear,  maintain good eye contact, mood depressed affect labile.  Per the patient she is currently suicidal with plans to overdose on heroin and or overdose on alcohol poisoning.  Patient also mentioned she has homicidal ideation with obsession of hurting a coworker, however patient has no plans, and stated that this is a thought that gives her relief  when she things about it.  Patient denies AVH, or paranoia.  Patient unable to contract for safety at this time therefore inpatient observation is recommended.  Plan discussed with patient and she is in agreement to stay  Recommendation.  Inpatient observation  PHQ 2-9:     Total Time spent with patient: 20 minutes  Musculoskeletal  Strength & Muscle Tone: within normal limits Gait & Station: normal Patient leans: N/A  Psychiatric Specialty Exam  Presentation General Appearance: Casual  Eye Contact:Good  Speech:Clear and Coherent  Speech Volume:Normal  Handedness:Ambidextrous   Mood and Affect  Mood:Anxious; Depressed; Labile  Affect:Depressed; Flat   Thought Process  Thought  Processes:Coherent  Descriptions of Associations:Circumstantial  Orientation:Full (Time, Place and Person)  Thought Content:Abstract Reasoning    Hallucinations:Hallucinations: None  Ideas of Reference:None  Suicidal Thoughts:Suicidal Thoughts: Yes, Active SI Active Intent and/or Plan: With Plan  Homicidal  Thoughts:Homicidal Thoughts: Yes, Passive HI Passive Intent and/or Plan: Without Intent   Sensorium  Memory:Immediate Fair  Judgment:Poor  Insight:Poor   Executive Functions  Concentration:Poor  Attention Span:Fair  [place]   Psychomotor Activity  Psychomotor Activity:Psychomotor Activity: Normal   Assets  Assets:No data recorded  Sleep  Sleep:Sleep: Fair   Nutritional Assessment (For OBS and FBC admissions only) Has the patient had a weight loss or gain of 10 pounds or more in the last 3 months?: No Has the patient had a decrease in food intake/or appetite?: No Does the patient have dental problems?: No Does the patient have eating habits or behaviors that may be indicators of an eating disorder including binging or inducing vomiting?: No Has the patient recently lost weight without trying?: 0    Physical Exam HENT:     Head: Normocephalic.     Nose: Nose normal.  Cardiovascular:     Rate and Rhythm: Normal rate.  Pulmonary:     Effort: Pulmonary effort is normal.  Musculoskeletal:        General: Normal range of motion.     Cervical back: Normal range of motion.  Skin:    General: Skin is warm.  Neurological:     General: No focal deficit present.     Mental Status: She is alert.   Review of Systems  Constitutional: Negative.   HENT: Negative.    Eyes: Negative.   Respiratory: Negative.    Cardiovascular: Negative.   Gastrointestinal: Negative.   Genitourinary: Negative.   Musculoskeletal: Negative.   Skin: Negative.   Neurological: Negative.   Endo/Heme/Allergies: Negative.   Psychiatric/Behavioral:  Positive for depression and suicidal ideas. The patient is nervous/anxious.    Blood pressure 110/66, pulse 86, temperature 99 F (37.2 C), temperature source Oral, resp. rate 18, SpO2 100 %. There is no height or weight on file to calculate BMI.  Past Psychiatric History: ADHD, bipolar disorder,  substance use disorder, suicidal ideation.  Is the patient at risk to self? Yes  Has the patient been a risk to self in the past 6 months? Yes .    Has the patient been a risk to self within the distant past? Yes   Is the patient a risk to others? Yes   Has the patient been a risk to others in the past 6 months? Yes   Has the patient been a risk to others within the distant past? Yes   Past Medical History: No past medical history on file. No past surgical history on file.  Family History: No family history on file.  Social History:  Social History   Socioeconomic History   Marital status: Single    Spouse name: Not on file   Number of children: Not on file   Years of education: Not on file   Highest education level: Not on file  Occupational History   Not on file  Tobacco Use   Smoking status: Not on file   Smokeless tobacco: Not on file  Substance and Sexual Activity   Alcohol use: Not on file   Drug use: Not on file   Sexual activity: Not on file  Other Topics Concern   Not on file  Social History Narrative  Not on file   Social Determinants of Health   Financial Resource Strain: Not on file  Food Insecurity: Not on file  Transportation Needs: Not on file  Physical Activity: Not on file  Stress: Not on file  Social Connections: Not on file  Intimate Partner Violence: Not on file    SDOH:  SDOH Screenings   Alcohol Screen: Not on file  Depression (PHQ2-9): Not on file  Financial Resource Strain: Not on file  Food Insecurity: Not on file  Housing: Not on file  Physical Activity: Not on file  Social Connections: Not on file  Stress: Not on file  Tobacco Use: Not on file  Transportation Needs: Not on file    Last Labs:  No visits with results within 6 Month(s) from this visit.  Latest known visit with results is:  Admission on 07/18/2011, Discharged on 07/18/2011  Component Date Value Ref Range Status   Color, Urine 07/18/2011 YELLOW  YELLOW Final    APPearance 07/18/2011 HAZY (A)  CLEAR Final   Specific Gravity, Urine 07/18/2011 1.024  1.005 - 1.030 Final   pH 07/18/2011 6.0  5.0 - 8.0 Final   Glucose, UA 07/18/2011 NEGATIVE  NEGATIVE mg/dL Final   Hgb urine dipstick 07/18/2011 LARGE (A)  NEGATIVE Final   Bilirubin Urine 07/18/2011 NEGATIVE  NEGATIVE Final   Ketones, ur 07/18/2011 15 (A)  NEGATIVE mg/dL Final   Protein, ur 07/18/2011 100 (A)  NEGATIVE mg/dL Final   Urobilinogen, UA 07/18/2011 1.0  0.0 - 1.0 mg/dL Final   Nitrite 07/18/2011 NEGATIVE  NEGATIVE Final   Leukocytes, UA 07/18/2011 MODERATE (A)  NEGATIVE Final   Preg Test, Ur 07/18/2011 NEGATIVE  NEGATIVE Final   Comment:                                 THE SENSITIVITY OF THIS                          METHODOLOGY IS >24 mIU/mL   Specimen Description 07/18/2011 URINE, CLEAN CATCH   Final   Special Requests 07/18/2011 [place] ADD [id number]   Final   Culture  Setup Time 07/18/2011 [phone]   Final   Colony Count 07/18/2011 7,000 COLONIES/ML   Final   Culture 07/18/2011 INSIGNIFICANT GROWTH   Final   Report Status 07/18/2011 07/19/2011 FINAL   Final   WBC 07/18/2011 16.2 (H)  4.5 - 13.5 K/uL Final   RBC 07/18/2011 4.11  3.80 - 5.70 MIL/uL Final   Hemoglobin 07/18/2011 12.6  12.0 - 16.0 g/dL Final   HCT 07/18/2011 36.3  36.0 - 49.0 % Final   MCV 07/18/2011 88.3  78.0 - 98.0 fL Final   MCH 07/18/2011 30.7  25.0 - 34.0 pg Final   MCHC 07/18/2011 34.7  31.0 - 37.0 g/dL Final   RDW 07/18/2011 13.5  11.4 - 15.5 % Final   Platelets 07/18/2011 151  150 - 400 K/uL Final   Neutrophils Relative % 07/18/2011 83 (H)  43 - 71 % Final   Neutro Abs 07/18/2011 13.4 (H)  1.7 - 8.0 K/uL Final   Lymphocytes Relative 07/18/2011 4 (L)  24 - 48 % Final   Lymphs Abs 07/18/2011 0.7 (L)  1.1 - 4.8 K/uL Final   Monocytes Relative 07/18/2011 13 (H)  3 - 11 % Final   Monocytes Absolute 07/18/2011 2.1 (H)  0.2 - 1.2 K/uL Final  Eosinophils Relative 07/18/2011 0  0 - 5 % Final   Eosinophils  Absolute 07/18/2011 0.0  0.0 - 1.2 K/uL Final   Basophils Relative 07/18/2011 0  0 - 1 % Final   Basophils Absolute 07/18/2011 0.0  0.0 - 0.1 K/uL Final   Specimen Description 07/18/2011 BLOOD LEFT ARM   Final   Special Requests 07/18/2011 BOTTLES DRAWN AEROBIC ONLY 1CC   Final   Culture  Setup Time 07/18/[phone]   Final   Culture 07/18/2011 NO GROWTH 5 DAYS   Final   Report Status 07/18/2011 07/24/2011 FINAL   Final   Sodium 07/18/2011 136  135 - 145 mEq/L Final   Potassium 07/18/2011 3.6  3.5 - 5.1 mEq/L Final   Chloride 07/18/2011 99  96 - 112 mEq/L Final   CO2 07/18/2011 24  19 - 32 mEq/L Final   Glucose, Bld 07/18/2011 100 (H)  70 - 99 mg/dL Final   BUN 07/18/2011 13  6 - 23 mg/dL Final   Creatinine, Ser 07/18/2011 0.92  0.47 - 1.00 mg/dL Final   Calcium 07/18/2011 9.5  8.4 - 10.5 mg/dL Final   GFR calc non Af Amer 07/18/2011 NOT CALCULATED  >90 mL/min Final   GFR calc Af Amer 07/18/2011 NOT CALCULATED  >90 mL/min Final   Comment:                                 The eGFR has been calculated                          using the CKD EPI equation.                          This calculation has not been                          validated in all clinical                          situations.                          eGFR's persistently                          <90 mL/min signify                          possible Chronic Kidney Disease.   Squamous Epithelial / LPF 07/18/2011 FEW (A)  RARE Final   WBC, UA 07/18/2011 11-20  <3 WBC/hpf Final   RBC / HPF 07/18/2011 11-20  <3 RBC/hpf Final   Bacteria, UA 07/18/2011 FEW (A)  RARE Final    Allergies: Patient has no known allergies.  PTA Medications: (Not in a hospital admission)   Medical Decision Making  Inpatient observation.   Lab Orders         Resp Panel by RT-PCR (Flu A&B, Covid) Nasopharyngeal Swab         CBC with Differential/Platelet         Comprehensive metabolic panel         Hemoglobin A1c         Ethanol          Lipid  panel         TSH         Pregnancy, urine         POCT Urine Drug Screen - (I-Screen)      Meds ordered this encounter  Medications   acetaminophen (TYLENOL) tablet 650 mg   alum & mag hydroxide-simeth (MAALOX/MYLANTA) 200-200-20 MG/5ML suspension 30 mL   magnesium hydroxide (MILK OF MAGNESIA) suspension 30 mL     Recommendations  Based on my evaluation the patient does not appear to have an emergency medical condition.  Evette Georges, NP 06/23/21  5:36 AM

## 2021-06-27 DIAGNOSIS — F319 Bipolar disorder, unspecified: Secondary | ICD-10-CM | POA: Diagnosis not present

## 2022-05-22 ENCOUNTER — Ambulatory Visit (HOSPITAL_COMMUNITY)
Admission: EM | Admit: 2022-05-22 | Discharge: 2022-05-22 | Disposition: A | Discharge status: Home / Self Care | Payer: 59

## 2022-05-22 DIAGNOSIS — R4689 Other symptoms and signs involving appearance and behavior: Secondary | ICD-10-CM

## 2022-05-22 DIAGNOSIS — R4587 Impulsiveness: Secondary | ICD-10-CM

## 2022-05-22 DIAGNOSIS — F411 Generalized anxiety disorder: Secondary | ICD-10-CM
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
## 2022-05-22 NOTE — Discharge Instructions (Signed)
F/u with outpatient psychiatry at [facility]  F/u with walk-in psychiatry
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
## 2022-06-14 ENCOUNTER — Emergency Department (HOSPITAL_COMMUNITY)
Admission: EM | Admit: 2022-06-14 | Discharge: 2022-06-14 | Disposition: A | Discharge status: Other Acute Inpatient Hospital | Payer: 59 | Source: Home / Self Care | Attending: Emergency Medicine | Admitting: Emergency Medicine

## 2022-06-14 ENCOUNTER — Encounter (HOSPITAL_COMMUNITY): Payer: Self-pay

## 2022-06-14 ENCOUNTER — Inpatient Hospital Stay (HOSPITAL_COMMUNITY)
Admission: AD | Admit: 2022-06-14 | Discharge: 2022-06-21 | DRG: 885 | Disposition: A | Discharge status: Home / Self Care | Payer: 59 | Source: Other Acute Inpatient Hospital | Attending: Psychiatry | Admitting: Psychiatry

## 2022-06-14 DIAGNOSIS — Z79899 Other long term (current) drug therapy: Secondary | ICD-10-CM | POA: Diagnosis not present

## 2022-06-14 DIAGNOSIS — Z91148 Patient's other noncompliance with medication regimen for other reason: Secondary | ICD-10-CM

## 2022-06-14 DIAGNOSIS — R41843 Psychomotor deficit: Secondary | ICD-10-CM | POA: Diagnosis present

## 2022-06-14 DIAGNOSIS — F4010 Social phobia, unspecified: Secondary | ICD-10-CM | POA: Diagnosis present

## 2022-06-14 DIAGNOSIS — Z91199 Patient's noncompliance with other medical treatment and regimen due to unspecified reason: Secondary | ICD-10-CM | POA: Diagnosis not present

## 2022-06-14 DIAGNOSIS — F17290 Nicotine dependence, other tobacco product, uncomplicated: Secondary | ICD-10-CM | POA: Diagnosis present

## 2022-06-14 DIAGNOSIS — F329 Major depressive disorder, single episode, unspecified: Secondary | ICD-10-CM | POA: Insufficient documentation

## 2022-06-14 DIAGNOSIS — F909 Attention-deficit hyperactivity disorder, unspecified type: Secondary | ICD-10-CM | POA: Diagnosis present

## 2022-06-14 DIAGNOSIS — R45851 Suicidal ideations: Secondary | ICD-10-CM | POA: Insufficient documentation

## 2022-06-14 DIAGNOSIS — R21 Rash and other nonspecific skin eruption: Secondary | ICD-10-CM | POA: Insufficient documentation

## 2022-06-14 DIAGNOSIS — F331 Major depressive disorder, recurrent, moderate: Secondary | ICD-10-CM | POA: Diagnosis not present

## 2022-06-14 DIAGNOSIS — F3163 Bipolar disorder, current episode mixed, severe, without psychotic features: Secondary | ICD-10-CM | POA: Diagnosis not present

## 2022-06-14 DIAGNOSIS — F314 Bipolar disorder, current episode depressed, severe, without psychotic features: Principal | ICD-10-CM | POA: Insufficient documentation

## 2022-06-14 LAB — COMPREHENSIVE METABOLIC PANEL
ALT: 18 U/L (ref 0–44)
AST: 21 U/L (ref 15–41)
Albumin: 5 g/dL (ref 3.5–5.0)
Alkaline Phosphatase: 53 U/L (ref 38–126)
Anion gap: 7 (ref 5–15)
BUN: 13 mg/dL (ref 6–20)
CO2: 25 mmol/L (ref 22–32)
Calcium: 9.9 mg/dL (ref 8.9–10.3)
Chloride: 107 mmol/L (ref 98–111)
Creatinine, Ser: 0.82 mg/dL (ref 0.44–1.00)
GFR, Estimated: >60 mL/min (ref >60)
Glucose, Bld: 106 mg/dL — ABNORMAL HIGH (ref 70–99)
Potassium: 3.4 mmol/L — ABNORMAL LOW (ref 3.5–5.1)
Sodium: 139 mmol/L (ref 135–145)
Total Bilirubin: 0.3 mg/dL (ref 0.3–1.2)
Total Protein: 8 g/dL (ref 6.5–8.1)

## 2022-06-14 LAB — SALICYLATE LEVEL: Salicylate Lvl: <7 mg/dL — ABNORMAL LOW (ref 7.0–30.0)

## 2022-06-14 LAB — ETHANOL: Alcohol, Ethyl (B): <10 mg/dL (ref <10)

## 2022-06-14 LAB — RAPID URINE DRUG SCREEN, HOSP PERFORMED
Amphetamines: NOT DETECTED
Barbiturates: NOT DETECTED
Benzodiazepines: NOT DETECTED
Cocaine: NOT DETECTED
Opiates: NOT DETECTED
Tetrahydrocannabinol: NOT DETECTED

## 2022-06-14 LAB — ACETAMINOPHEN LEVEL: Acetaminophen (Tylenol), Serum: <10 ug/mL — ABNORMAL LOW (ref 10–30)

## 2022-06-14 LAB — CBC
HCT: 42.5 % (ref 36.0–46.0)
Hemoglobin: 13.6 g/dL (ref 12.0–15.0)
MCH: 29.2 pg (ref 26.0–34.0)
MCHC: 32 g/dL (ref 30.0–36.0)
MCV: 91.4 fL (ref 80.0–100.0)
Platelets: 297 10*3/uL (ref 150–400)
RBC: 4.65 MIL/uL (ref 3.87–5.11)
RDW: 13.2 % (ref 11.5–15.5)
WBC: 7.1 10*3/uL (ref 4.0–10.5)
nRBC: 0 % (ref 0.0–0.2)

## 2022-06-14 LAB — LITHIUM LEVEL: Lithium Lvl: 0.31 mmol/L — ABNORMAL LOW (ref 0.60–1.20)

## 2022-06-14 LAB — I-STAT BETA HCG BLOOD, ED (MC, WL, AP ONLY): I-stat hCG, quantitative: 7.3 m[IU]/mL — ABNORMAL HIGH (ref <5)

## 2022-06-14 LAB — HCG, QUANTITATIVE, PREGNANCY: hCG, Beta Chain, Quant, S: <1 m[IU]/mL (ref <5)

## 2022-06-14 MED ORDER — CARIPRAZINE HCL 1.5 MG PO CAPS
1.5000 mg | ORAL_CAPSULE | Freq: Every day | ORAL | Status: DC
  Administered 2022-06-15 – 2022-06-16 (×2): 1.5 mg via ORAL
  Filled 2022-06-14 (×3): qty 1

## 2022-06-14 MED ORDER — GABAPENTIN 100 MG PO CAPS
200.0000 mg | ORAL_CAPSULE | Freq: Two times a day (BID) | ORAL | Status: DC
  Administered 2022-06-14 – 2022-06-15 (×2): 200 mg via ORAL
  Filled 2022-06-14 (×7): qty 2

## 2022-06-14 MED ORDER — ALUM & MAG HYDROXIDE-SIMETH 200-200-20 MG/5ML PO SUSP: 30.0000 mL | ORAL | Status: DC | PRN

## 2022-06-14 MED ORDER — LAMOTRIGINE 25 MG PO TABS
25.0000 mg | ORAL_TABLET | Freq: Every day | ORAL | Status: DC
  Administered 2022-06-15: 25 mg via ORAL
  Filled 2022-06-14 (×3): qty 1

## 2022-06-14 MED ORDER — ALUM & MAG HYDROXIDE-SIMETH 200-200-20 MG/5ML PO SUSP: 30.0000 mL | Freq: Four times a day (QID) | ORAL | Status: DC | PRN

## 2022-06-14 MED ORDER — LITHIUM CARBONATE ER 300 MG PO TBCR: 900.0000 mg | EXTENDED_RELEASE_TABLET | Freq: Every day | ORAL | Status: DC

## 2022-06-14 MED ORDER — PROPRANOLOL HCL 20 MG PO TABS
20.0000 mg | ORAL_TABLET | Freq: Two times a day (BID) | ORAL | Status: DC
  Administered 2022-06-14: 20 mg via ORAL
  Filled 2022-06-14: qty 1

## 2022-06-14 MED ORDER — CARIPRAZINE HCL 3 MG PO CAPS
4.5000 mg | ORAL_CAPSULE | Freq: Every day | ORAL | Status: DC
  Administered 2022-06-14 – 2022-06-20 (×7): 4.5 mg via ORAL
  Filled 2022-06-14 (×10): qty 1

## 2022-06-14 MED ORDER — TRAZODONE HCL 50 MG PO TABS
50.0000 mg | ORAL_TABLET | Freq: Every evening | ORAL | Status: DC | PRN
  Administered 2022-06-15 – 2022-06-20 (×3): 50 mg via ORAL
  Filled 2022-06-14 (×3): qty 1

## 2022-06-14 MED ORDER — CARIPRAZINE HCL 1.5 MG PO CAPS
4.5000 mg | ORAL_CAPSULE | Freq: Every day | ORAL | Status: DC
  Filled 2022-06-14: qty 3

## 2022-06-14 MED ORDER — ONDANSETRON HCL 4 MG PO TABS: 4.0000 mg | ORAL_TABLET | Freq: Three times a day (TID) | ORAL | Status: DC | PRN

## 2022-06-14 MED ORDER — GABAPENTIN 300 MG PO CAPS: 300.0000 mg | ORAL_CAPSULE | Freq: Every day | ORAL | Status: DC

## 2022-06-14 MED ORDER — MAGNESIUM HYDROXIDE 400 MG/5ML PO SUSP: 30.0000 mL | Freq: Every day | ORAL | Status: DC | PRN

## 2022-06-14 MED ORDER — BENZTROPINE MESYLATE 1 MG PO TABS
1.0000 mg | ORAL_TABLET | Freq: Two times a day (BID) | ORAL | Status: DC
  Filled 2022-06-14: qty 2

## 2022-06-14 MED ORDER — LAMOTRIGINE 25 MG PO TABS
25.0000 mg | ORAL_TABLET | Freq: Every day | ORAL | Status: DC
  Administered 2022-06-14: 25 mg via ORAL
  Filled 2022-06-14: qty 1

## 2022-06-14 MED ORDER — ACETAMINOPHEN 325 MG PO TABS: 650.0000 mg | ORAL_TABLET | ORAL | Status: DC | PRN

## 2022-06-14 MED ORDER — ACETAMINOPHEN 325 MG PO TABS: 650.0000 mg | ORAL_TABLET | Freq: Four times a day (QID) | ORAL | Status: DC | PRN

## 2022-06-14 MED ORDER — ZIPRASIDONE MESYLATE 20 MG IM SOLR: 20.0000 mg | Freq: Two times a day (BID) | INTRAMUSCULAR | Status: DC | PRN

## 2022-06-14 MED ORDER — LORAZEPAM 1 MG PO TABS: 1.0000 mg | ORAL_TABLET | ORAL | Status: DC | PRN

## 2022-06-14 NOTE — Progress Notes (Signed)
Adult Psychoeducational Group Note  Date:  06/14/2022 Time:  8:14 PM  Group Topic/Focus:  Wrap-Up Group:   The focus of this group is to help patients review their daily goal of treatment and discuss progress on daily workbooks.  Participation Level:  Active  Participation Quality:  Appropriate  Affect:  Appropriate  Cognitive:  Appropriate  Insight: Appropriate  Engagement in Group:  Engaged  Modes of Intervention:  Discussion  Additional Comments:  Lorae attend wrap up group NA.  Charna Busman Long 06/14/2022, 8:14 PM

## 2022-06-14 NOTE — BH Assessment (Signed)
Comprehensive Clinical Assessment (CCA) Note  06/14/2022 AZA KOPKA [id number]  Disposition: Sindy Guadeloupe, NP, patient meets inpatient criteria. Hassie Bruce, patient accepted to [facility] pending discharge. Jeanice Lim, Charity fundraiser, informed of disposition.   The patient demonstrates the following risk factors for suicide: Chronic risk factors for suicide include: psychiatric disorder of  bipolar, anxiety and ADHD . Acute risk factors for suicide include:  work related stressors . Protective factors for this patient include: positive social support, responsibility to others (children, family), and hope for the future. Considering these factors, the overall suicide risk at this point appears to be high. Patient is not appropriate for outpatient follow up.  Emma Holland is a 27 year old female presenting voluntary to [facility] due to SI with plan to "get really really drunk and overdose on Tylenol. Patient reported history of Bipolar, Anxiety and ADHD. Patient denied HI, psychosis and alcohol/drug usage. Patient reported onset of SI for 1 month with no identifying stressors or triggers. Patient reported "mood swings are coming faster than normal". Patient reports worsening depressive symptoms. Patient denied prior psych hospitalizations, suicide attempts and self harming behaviors. Patient reported normal sleep and appetite.   Patient is currently being seen at [facility] for medication management and has an appointment for outpatient therapy. Patient reported medications are not working.   Patient resides with boyfriend. Patient currently works for [employer] and reports work related stressors. Patient denied access to guns. Patient was cooperative during assessment. Patient unable to contract for safety.   Chief Complaint:  Chief Complaint  Patient presents with   Suicidal   Visit Diagnosis:  Major Depressive Disorder    CCA Screening, Triage and Referral (STR)  Patient Reported  Information How did you hear about [place]? Self  What Is the Reason for Your Visit/Call Today? SI with plan to "get really really drunk and overdose on Tylenol".  How Long Has This Been Causing You Problems? 1 wk - 1 month  What Do You Feel Would Help You the Most Today? Treatment for Depression or other mood problem   Have You Recently Had Any Thoughts About Hurting Yourself? Yes  Are You Planning to Commit Suicide/Harm Yourself At This time? Yes   Flowsheet Row ED from 06/14/2022 in [facility] Emergency Department at [facility] ED from 05/22/2022 in [facility] ED from 06/23/2021 in [facility]  C-SSRS RISK CATEGORY Moderate Risk No Risk High Risk       Have you Recently Had Thoughts About Hurting Someone [name]? No  Are You Planning to Harm Someone at This Time? No  Explanation: n/a   Have You Used Any Alcohol or Drugs in the Past 24 Hours? No  What Did You Use and How Much? n/a   Do You Currently Have a Therapist/Psychiatrist? Yes  Name of Therapist/Psychiatrist: Name of Therapist/Psychiatrist: [facility]   Have You Been Recently Discharged From Any Office Practice or Programs? No  Explanation of Discharge From Practice/Program: n/a     CCA Screening Triage Referral Assessment Type of Contact: Tele-Assessment  Telemedicine Service Delivery: Telemedicine service delivery: This service was provided via telemedicine using a 2-way, interactive audio and video technology  Is this Initial or Reassessment? Is this Initial or Reassessment?: Initial Assessment  Date Telepsych consult ordered in [facility]:  Date Telepsych consult ordered in [facility]: 06/14/22  Time Telepsych consult ordered in [facility]:  Time Telepsych consult ordered in [facility]: 0213  Location of Assessment: [facility] ED  Provider Location: [facility] Assessment Services   Collateral Involvement: none reported   Does Patient Have a Production manager Guardian? No  Legal Guardian Contact Information: n/a  Copy of Legal Guardianship Form: -- (n/a)  Legal Guardian Notified of Arrival: -- (n/a)  Legal Guardian Notified of Pending Discharge: -- (n/a)  If Minor and Not Living with Parent(s), Who has Custody? n/a  Is CPS involved or ever been involved? Never  Is APS involved or ever been involved? Never   Patient Determined To Be At Risk for Harm To Self or Others Based on Review of Patient Reported Information or Presenting Complaint? Yes, for Self-Harm  Method: Plan with intent and identified person  Availability of Means: In hand or used  Intent: Clearly intends on inflicting harm that could cause death  Notification Required: No need or identified person  Additional Information for Danger to Others Potential: -- (n/a)  Additional Comments for Danger to Others Potential: none  Are There Guns or Other Weapons in Your Home? No  Types of Guns/Weapons: n/a  Are These Weapons Safely Secured?                            -- (n/a)  Who Could Verify You Are Able To Have These Secured: n/a  Do You Have any Outstanding Charges, Pending Court Dates, Parole/Probation? none reported  Contacted To Inform of Risk of Harm To Self or Others: Family/Significant Other:    Does Patient Present under Involuntary Commitment? No    [place] of Residence: [place]   Patient Currently Receiving the Following Services: Medication Management; Individual Therapy   Determination of Need: Emergent (2 hours)   Options For Referral: Medication Management; Inpatient Hospitalization; Outpatient Therapy     CCA Biopsychosocial Patient Reported Schizophrenia/Schizoaffective Diagnosis in Past: No   Strengths: Self-awareness   Mental Health Symptoms Depression:   Difficulty Concentrating; Hopelessness; Worthlessness   Duration of Depressive symptoms:    Mania:   Racing thoughts; Recklessness   Anxiety:    Tension;  Worrying   Psychosis:   None   Duration of Psychotic symptoms:    Trauma:   None   Obsessions:   None   Compulsions:   None   Inattention:   N/A   Hyperactivity/Impulsivity:   N/A   Oppositional/Defiant Behaviors:   N/A   Emotional Irregularity:   Mood lability   Other Mood/Personality Symptoms:  No data recorded   Mental Status Exam Appearance and self-care  Stature:   Average   Weight:   Average weight   Clothing:   Casual   Grooming:   Normal   Cosmetic use:   Age appropriate   Posture/gait:   Normal   Motor activity:   Not Remarkable   Sensorium  Attention:   Normal   Concentration:   Normal   Orientation:   X5   Recall/memory:   Normal   Affect and Mood  Affect:   Depressed   Mood:   Depressed; Anxious   Relating  Eye contact:   Normal   Facial expression:   Responsive; Sad; Anxious   Attitude toward examiner:   Cooperative   Thought and Language  Speech flow:  Clear and Coherent   Thought content:   Appropriate to Mood and Circumstances   Preoccupation:   None   Hallucinations:   None   Organization:   Intact   [employer] of Knowledge:   Average  Intelligence:   Average   Abstraction:   Normal   Judgement:   Fair   Reality Testing:   Adequate   Insight:   Gaps   Decision Making:   Impulsive; Vacilates; Normal   Social Functioning  Social Maturity:   Responsible   Social Judgement:   Normal   Stress  Stressors:   Relationship   Coping Ability:   Exhausted; Overwhelmed   Skill Deficits:   Decision making; Interpersonal; Responsibility   Supports:   Family; Friends/Service system     Religion: Religion/Spirituality Are You A Religious Person?: No  Leisure/Recreation: Leisure / Recreation Do You Have Hobbies?: No  Exercise/Diet: Exercise/Diet Do You Exercise?: No Have You Gained or Lost A Significant Amount of Weight in the Past Six Months?: No Do  You Follow a Special Diet?: No Do You Have Any Trouble Sleeping?: Yes   CCA Employment/Education Employment/Work Situation: Employment / Work Situation Employment Situation: Employed Work Stressors: [employer] very stressful Patient's Job has Been Impacted by Current Illness: Yes Describe how Patient's Job has Been Impacted: more depressed Has Patient ever Been in the [employer]?: No  Education: Education Is Patient Currently Attending School?: No Last Grade Completed: 12 Did You Product manager?: No Did You Have An Individualized Education Program (IIEP): No Did You Have Any Difficulty At [employer]?: No Patient's Education Has Been Impacted by Current Illness: No   CCA Family/Childhood History Family and Relationship History: Family history Marital status: Single Does patient have children?: No  Childhood History:  Childhood History By whom was/is the patient raised?: Both parents Did patient suffer any verbal/emotional/physical/sexual abuse as a child?: No Did patient suffer from severe childhood neglect?: No Has patient ever been sexually abused/assaulted/raped as an adolescent or adult?: No Was the patient ever a victim of a crime or a disaster?: No Witnessed domestic violence?: No Has patient been affected by domestic violence as an adult?: No       CCA Substance Use Alcohol/Drug Use: Alcohol / Drug Use Pain Medications: see MAR Prescriptions: see MAR Over the Counter: see MAR History of alcohol / drug use?: No history of alcohol / drug abuse Longest period of sobriety (when/how long): n/a Negative Consequences of Use:  (n/a) Withdrawal Symptoms:  (n/a)                         ASAM's:  Six Dimensions of Multidimensional Assessment  Dimension 1:  Acute Intoxication and/or Withdrawal Potential:   Dimension 1:  Description of individual's past and current experiences of substance use and withdrawal: n/a  Dimension 2:  Biomedical Conditions and  Complications:   Dimension 2:  Description of patient's biomedical conditions and  complications: n/a  Dimension 3:  Emotional, Behavioral, or Cognitive Conditions and Complications:  Dimension 3:  Description of emotional, behavioral, or cognitive conditions and complications: n/a  Dimension 4:  Readiness to Change:  Dimension 4:  Description of Readiness to Change criteria: n/a  Dimension 5:  Relapse, Continued use, or Continued Problem Potential:  Dimension 5:  Relapse, continued use, or continued problem potential critiera description: n/a  Dimension 6:  Recovery/Living Environment:  Dimension 6:  Recovery/Iiving environment criteria description: n/a  ASAM Severity Score:    ASAM Recommended Level of Treatment: ASAM Recommended Level of Treatment:  (n/a)   Substance use Disorder (SUD) Substance Use Disorder (SUD)  Checklist Symptoms of Substance Use:  (n/a)  Recommendations for Services/Supports/Treatments: Recommendations for Services/Supports/Treatments Recommendations For Services/Supports/Treatments: Individual Therapy, Inpatient Hospitalization,  Medication Management  Discharge Disposition: Discharge Disposition Medical Exam completed: Yes Disposition of Patient: Admit  DSM5 Diagnoses: There are no problems to display for this patient.    Referrals to Alternative Service(s): Referred to Alternative Service(s):   Place:   Date:   Time:    Referred to Alternative Service(s):   Place:   Date:   Time:    Referred to Alternative Service(s):   Place:   Date:   Time:    Referred to Alternative Service(s):   Place:   Date:   Time:     Burnetta Sabin, [facility]

## 2022-06-14 NOTE — Progress Notes (Signed)
Pt is a 28 y/o female admitted to [facility] under voluntary status for worsening depression and suicidal ideation. A & O X4 with sullen affect, fair eye contact, tearful with logical speech on interactions. Denies SI at this time "I don't want to hurt myself but I don't trust myself to go home". Verbally contracts for safety. 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 It's been stressful" States she lives with her fiancee of 5 years. Denies substance abuse, UDS done on 06/14/22 is negative "I only vape nicotine". Denies all forms of abuse. Skin assessed, ecchymosis and scabs in various stages of healing noted on pt's body "It's from my cat, I'm allergic to cat". Pt had no belongings on admission "I have to call my boyfriend to bring me clothes". Pt ambulatory to unit with a steady gait. Unit orientation done, routines discussed, care plan reviewed and admission documents signed. Safety checks initiated at Q 15 minutes intervals without incident. Emotional support and reassurance offered. Pt encouraged to attend to her ADLs and to voice concerns. Tolerated sandwich tray and fluids well. Remains safe in milieu.

## 2022-06-14 NOTE — Progress Notes (Signed)
Pt was accepted to [facility] TODAY 06/14/2022, pending signed voluntary consent faxed to [phone]. Bed assignment: 304-2  Pt meets inpatient criteria per Hillery Jacks, NP  Attending Physician will be Uvaldo Rising, MD  Report can be called to: - Adult unit: [phone]  Pt can arrive after pending items are received  Care Team Notified: [facility] Rona Ravens, RN, Hillery Jacks, NP, Thornton Dales, RN, and [facility], NT  [place], [place]  06/14/2022 10:56 AM

## 2022-06-14 NOTE — BH Assessment (Signed)
Jeanice Lim, RN locating cart for TTS assessment.

## 2022-06-14 NOTE — ED Provider Notes (Signed)
[place] EMERGENCY DEPARTMENT AT [facility] Provider Note   CSN: [id number] Arrival date & time: 06/14/22  0007     History  Chief Complaint  Patient presents with   Suicidal    Emma Holland is a 27 y.o. female.  27 year old female brought in by fiance for SI with plan to drink and overdose on Tylenol. No prior suicide attempts, does admit to prior self harm. Has been feeling suicidal and moody recently, worse today, missed meds Monday night. Denies HI, drug or alcohol use. Reports rash to back and arm after exposure to cat and developing hives, is improving and not requesting treatment for this.        Home Medications Prior to Admission medications   Medication Sig Start Date End Date Taking? Authorizing Provider  etonogestrel-ethinyl estradiol (NUVARING) 0.12-0.015 MG/24HR vaginal ring Place 1 each vaginally every 28 (twenty-eight) days. 10/10/21  Yes [provider]  gabapentin (NEURONTIN) 300 MG capsule Take 300-600 mg by mouth at bedtime as needed. 05/18/22  Yes [provider]  lamoTRIgine (LAMICTAL) 25 MG tablet Take 25 mg by mouth daily. 06/01/22  Yes [provider]  lithium 300 MG tablet Take 900 mg by mouth at bedtime.   Yes [provider]  propranolol (INDERAL) 20 MG tablet Take 20 mg by mouth 2 (two) times daily. 05/19/22  Yes [provider]  VRAYLAR 4.5 MG CAPS Take 1 capsule by mouth at bedtime. 05/19/22  Yes [provider]      Allergies    Patient has no known allergies.    Review of Systems   Review of Systems Level 5 caveat for psychiatric condition  Physical Exam Updated Vital Signs BP (!) 127/95 (BP Location: Right Arm)   Pulse 91   Temp 99.4 F (37.4 C) (Oral)   Resp 17   LMP 05/30/2022   SpO2 100%  Physical Exam Vitals and nursing note reviewed.  Constitutional:      General: She is not in acute distress.    Appearance: She is well-developed. She is not diaphoretic.  HENT:      Head: Normocephalic and atraumatic.  Cardiovascular:     Rate and Rhythm: Normal rate and regular rhythm.     Heart sounds: Normal heart sounds.  Pulmonary:     Effort: Pulmonary effort is normal.     Breath sounds: Normal breath sounds.  Skin:    General: Skin is warm and dry.     Findings: Rash present.  Neurological:     Mental Status: She is alert and oriented to person, place, and time.  Psychiatric:        Behavior: Behavior normal.     ED Results / Procedures / Treatments   Labs (all labs ordered are listed, but only abnormal results are displayed) Labs Reviewed  COMPREHENSIVE METABOLIC PANEL - Abnormal; Notable for the following components:      Result Value   Potassium 3.4 (*)    Glucose, Bld 106 (*)    All other components within normal limits  SALICYLATE LEVEL - Abnormal; Notable for the following components:   Salicylate Lvl <7.0 (*)    All other components within normal limits  ACETAMINOPHEN LEVEL - Abnormal; Notable for the following components:   Acetaminophen (Tylenol), Serum <10 (*)    All other components within normal limits  LITHIUM LEVEL - Abnormal; Notable for the following components:   Lithium Lvl 0.31 (*)    All other components within  normal limits  I-STAT BETA HCG BLOOD, ED ([facility], [facility], [facility] ONLY) - Abnormal; Notable for the following components:   I-stat hCG, quantitative 7.3 (*)    All other components within normal limits  ETHANOL  CBC  HCG, QUANTITATIVE, PREGNANCY  RAPID URINE DRUG SCREEN, HOSP PERFORMED    EKG None  Radiology No results found.  Procedures Procedures    Medications Ordered in ED Medications  acetaminophen (TYLENOL) tablet 650 mg (has no administration in time range)  ondansetron (ZOFRAN) tablet 4 mg (has no administration in time range)  alum & mag hydroxide-simeth (MAALOX/MYLANTA) 200-200-20 MG/5ML suspension 30 mL (has no administration in time range)  benztropine (COGENTIN) tablet 1 mg (has no administration  in time range)  lamoTRIgine (LAMICTAL) tablet 25 mg (has no administration in time range)  lithium carbonate (LITHOBID) ER tablet 900 mg (has no administration in time range)  propranolol (INDERAL) tablet 20 mg (has no administration in time range)  cariprazine (VRAYLAR) capsule 4.5 mg (has no administration in time range)  gabapentin (NEURONTIN) capsule 300 mg (has no administration in time range)    ED Course/ Medical Decision Making/ A&P Clinical Course as of 06/14/22 0604  Wed Jun 14, 2022  0602 Medically cleared for behavioral health evaluation and disposition. [LM]    Clinical Course User Index [LM] Jeannie Fend, PA-C                             Medical Decision Making Amount and/or Complexity of Data Reviewed Labs: ordered.  Risk OTC drugs. Prescription drug management.   This patient presents to the ED for concern of SI with plan, this involves an extensive number of treatment options, and is a complaint that carries with it a high risk of complications and morbidity.  The differential diagnosis includes psychosis    Co morbidities that complicate the patient evaluation  Bipolar disorder, anxiety, ADHD   Additional history obtained:  Additional history obtained from fiance at bedside who contributes to history as above External records from outside source obtained and reviewed including recent visit to [facility] dated 05/22/22   Lab Tests:  I Ordered, and personally interpreted labs.  The pertinent results include:  lithium is subtherapeutic at 0.31. etoh negative. CBC WNL. CMP with mild hypokalemia 3.4. APAP and salicylate negative. Hcg mildly elevated at 7.3, on nuvaring, formal quant obtained and is negative.   Cardiac Monitoring: / EKG:  The patient was maintained on a cardiac monitor.  I personally viewed and interpreted the cardiac monitored which showed an underlying rhythm of: sinus brady, rate 59   Consultations Obtained:  I requested consultation  with the behavioral health team,  and discussed lab and imaging findings as well as pertinent plan - they recommend: Consult pending at change of shift   Problem List / ED Course / Critical interventions / Medication management  27 year old female presents with suicidal ideation with plan to drink alcohol and overdose on Tylenol.  Presents voluntarily.  She is medically cleared for behavioral health evaluation and disposition. I ordered medication including home medications I have reviewed the patients home medicines and have made adjustments as needed   Social Determinants of Health:  Has behavioral health provider   Test / Admission - Considered:  Disposition pending at change of shift        Final Clinical Impression(s) / ED Diagnoses Final diagnoses:  Suicidal ideation    Rx / DC Orders ED Discharge Orders  None         Alden Hipp 06/14/22 Lennie Hummer, April, MD 06/14/22 1610

## 2022-06-14 NOTE — ED Notes (Signed)
Voluntary consent form faxed to [facility]. 

## 2022-06-14 NOTE — ED Provider Notes (Signed)
I assumed care of the patient at 1500.  Briefly the patient is here for suicidal ideation.  She has received a bed at behavioral health.   Melene Plan, DO 06/14/22 1755

## 2022-06-14 NOTE — ED Notes (Signed)
TTS cart at bedside. 

## 2022-06-14 NOTE — ED Triage Notes (Signed)
States she has been feeling moody and suicidal when she's at work for a while but today it worsened. Reports doesn't like taking her medication, took them today but has not since Sunday.. Her plan would be to overdose. Reports history of self harm but no SI attempts.

## 2022-06-14 NOTE — Tx Team (Signed)
Initial Treatment Plan 06/14/2022 8:20 PM [employer] WUJ:[id number]    PATIENT STRESSORS: Financial difficulties   Medication change or noncompliance   Occupational concerns     PATIENT STRENGTHS: Capable of independent living  Personnel officer means  Work skills    PATIENT IDENTIFIED PROBLEMS: Alterations in mood "I've been depressed for months now".    Medication noncompliant     Risk for suicide              DISCHARGE CRITERIA:  Improved stabilization in mood, thinking, and/or behavior Verbal commitment to aftercare and medication compliance  PRELIMINARY DISCHARGE PLAN: Outpatient therapy Return to previous living arrangement Return to previous work or school arrangements  PATIENT/FAMILY INVOLVEMENT: This treatment plan has been presented to and reviewed with the patient, CLEOPATRA PINET.  The patient have been given the opportunity to ask questions and make suggestions.  Sherryl Manges, RN 06/14/2022, 8:20 PM

## 2022-06-14 NOTE — ED Notes (Addendum)
Patient's boyfriend in the room despite not supposed the fact that he is not supposed to be here. When he was told he need to leave due to the policy on [facility] patient's, he took a phone from the patient as well. Patient's boyfriend took clothing home.

## 2022-06-15 ENCOUNTER — Encounter (HOSPITAL_COMMUNITY): Payer: Self-pay | Admitting: Psychiatry

## 2022-06-15 DIAGNOSIS — F331 Major depressive disorder, recurrent, moderate: Secondary | ICD-10-CM

## 2022-06-15 MED ORDER — LITHIUM CARBONATE 300 MG PO TABS: 900.0000 mg | ORAL_TABLET | Freq: Every day | ORAL | Status: DC

## 2022-06-15 MED ORDER — PROPRANOLOL HCL 20 MG PO TABS
20.0000 mg | ORAL_TABLET | Freq: Two times a day (BID) | ORAL | Status: DC
  Administered 2022-06-15 – 2022-06-19 (×8): 20 mg via ORAL
  Filled 2022-06-15 (×12): qty 1

## 2022-06-15 MED ORDER — LAMOTRIGINE 25 MG PO TABS
50.0000 mg | ORAL_TABLET | Freq: Every day | ORAL | Status: DC
  Administered 2022-06-16 – 2022-06-21 (×6): 50 mg via ORAL
  Filled 2022-06-15 (×8): qty 2

## 2022-06-15 MED ORDER — GABAPENTIN 400 MG PO CAPS
400.0000 mg | ORAL_CAPSULE | Freq: Every day | ORAL | Status: DC
  Administered 2022-06-16 – 2022-06-20 (×5): 400 mg via ORAL
  Filled 2022-06-15 (×7): qty 1

## 2022-06-15 MED ORDER — LITHIUM CARBONATE 300 MG PO CAPS
300.0000 mg | ORAL_CAPSULE | Freq: Three times a day (TID) | ORAL | Status: DC
  Administered 2022-06-15 – 2022-06-16 (×3): 300 mg via ORAL
  Filled 2022-06-15 (×7): qty 1

## 2022-06-15 NOTE — Progress Notes (Signed)
   06/15/22 0900  Psych Admission Type (Psych Patients Only)  Admission Status Voluntary  Psychosocial Assessment  Patient Complaints Anxiety;Depression  Eye Contact Fair  Facial Expression Anxious;Sad  Affect Depressed  Speech Logical/coherent  Interaction Assertive  Motor Activity Slow  Appearance/Hygiene Unremarkable  Behavior Characteristics Cooperative;Appropriate to situation  Mood Depressed  Aggressive Behavior  Effect No apparent injury  Thought Process  Coherency WDL  Content WDL  Delusions None reported or observed  Perception WDL  Hallucination None reported or observed  Judgment Poor  Confusion None  Danger to Self  Current suicidal ideation? Denies  Danger to Others  Danger to Others None reported or observed

## 2022-06-15 NOTE — Progress Notes (Signed)
   06/15/22 2000  Psych Admission Type (Psych Patients Only)  Admission Status Voluntary  Psychosocial Assessment  Eye Contact Fair  Facial Expression Anxious  Affect Depressed  Speech Logical/coherent  Interaction Assertive  Motor Activity Slow  Appearance/Hygiene Unremarkable  Behavior Characteristics Cooperative;Appropriate to situation  Mood Depressed;Pleasant  Thought Process  Coherency Circumstantial  Content Blaming self  Delusions None reported or observed  Perception WDL  Hallucination None reported or observed  Judgment Poor  Confusion None  Danger to Self  Current suicidal ideation? Denies  Danger to Others  Danger to Others None reported or observed

## 2022-06-15 NOTE — Plan of Care (Signed)

## 2022-06-15 NOTE — Group Note (Signed)
Date:  06/15/2022 Time:  11:14 AM  Group Topic/Focus:  Goals Group:   The focus of this group is to help patients establish daily goals to achieve during treatment and discuss how the patient can incorporate goal setting into their daily lives to aide in recovery.    Participation Level:  Active  Participation Quality:  Appropriate  Affect:  Appropriate  Cognitive:  Appropriate  Insight: Appropriate  Engagement in Group:  Engaged  Modes of Intervention:  Discussion  Additional Comments:     Reymundo Poll 06/15/2022, 11:14 AM

## 2022-06-15 NOTE — Group Note (Signed)
Occupational Therapy Group Note  Group Topic:Coping Skills  Group Date: 06/15/2022 Start Time: 1430 End Time: 1509 Facilitators: Ted Mcalpine, OT   Group Description: Group encouraged increased engagement and participation through discussion and activity focused on "Coping Ahead." Patients were split up into teams and selected a card from a stack of positive coping strategies. Patients were instructed to act out/charade the coping skill for other peers to guess and receive points for their team. Discussion followed with a focus on identifying additional positive coping strategies and patients shared how they were going to cope ahead over the weekend while continuing hospitalization stay.  Therapeutic Goal(s): Identify positive vs negative coping strategies. Identify coping skills to be used during hospitalization vs coping skills outside of hospital/at home Increase participation in therapeutic group environment and promote engagement in treatment   Participation Level: Engaged   Participation Quality: Independent   Behavior: Appropriate   Speech/Thought Process: Relevant   Affect/Mood: Appropriate   Insight: Fair   Judgement: Fair      Modes of Intervention: Education  Patient Response to Interventions:  Attentive   Plan: Continue to engage patient in OT groups 2 - 3x/week.  06/15/2022  Ted Mcalpine, OT  Kerrin Champagne, OT

## 2022-06-15 NOTE — H&P (Signed)
Psychiatric Admission Assessment Adult  Patient Identification: Emma Holland MRN:  [id number] Date of Evaluation:  06/15/2022 Chief Complaint:  MDD (major depressive disorder) [F32.9] Principal Diagnosis: MDD (major depressive disorder) Diagnosis:  Principal Problem:   MDD (major depressive disorder)   Identifying information and reason for admission: The patient is a 27 year old Caucasian female was admitted on a voluntary status for symptoms of depression and suicidal ideations in the context of medication noncompliance. History of Present Illness:  This was primarily obtained from the patient interview and records.  Patient reports that she had missed a few doses of medication.  She was also dealing with multiple stressors at work.  She endorses increasing depressive symptoms with suicidal ideation with a plan to get really drunk and overdose on Tylenol.  She gives a history of bipolar disorder, anxiety and ADHD.  She reports that she has had suicidal ideations for about a month with no specific stressors other than the job stress and noncompliance with her medications.  She has been having increasing mood swings.  Apparently in her lamotrigine is being titrated up.  She reports p fair sleep and appetite.  [facility]  She reports that she never been hospitalized.  She currently lives with her boyfriend and reports that she works at [employer] and that her boyfriend works full time in an Scientist, forensic.  No relationship issues are noted.  Patient came to the ED requesting admission.  She also wanted her medications were restarted.  Associated Signs/Symptoms: Depression Symptoms:  depressed mood, psychomotor retardation, difficulty concentrating, hopelessness, suicidal thoughts with specific plan, anxiety, (Hypo) Manic Symptoms:  Distractibility, Impulsivity, Irritable Mood, Anxiety Symptoms:  Social Anxiety, Psychotic Symptoms:   No  psychosis noted. PTSD Symptoms: Negative Total Time spent with patient: 30 minutes  Past Psychiatric History: Patient is [facility] for bipolar disorder.  She reports that she has also been diagnosed as ADHD and anxiety.  Is the patient at risk to self? Yes.    Has the patient been a risk to self in the past 6 months? No.  Has the patient been a risk to self within the distant past? No.  Is the patient a risk to others? No.  Has the patient been a risk to others in the past 6 months? No.  Has the patient been a risk to others within the distant past? No.   [place] Scale:  Flowsheet Row [facility] (Current[facility] Most recent reading at 06/14/2022  7:00 PM ED from 06/14/2022 in [facility] Emergency Department at [facility] Most recent reading at 06/14/2022 12:34 AM ED from 05/22/2022 in [facility] Most recent reading at 05/22/2022 10:14 PM  C-SSRS RISK CATEGORY High Risk Moderate Risk No Risk        Prior Inpatient Therapy: No. If yes, describe patient reports that she has never been hospitalized in the past. Prior Outpatient Therapy: Yes.   If yes, describe patient reports that she is seeing a therapist and a Veterinary surgeon.  Alcohol Screening:   Substance Abuse History in the last 12 months:  No. Consequences of Substance Abuse: Negative Previous Psychotropic Medications: Yes  Psychological Evaluations: No  Past Medical History: No past medical history on file. No past surgical history on file. Family History: No family history on file. Family Psychiatric  History: Unknown at this time. Tobacco Screening:  Social History   Tobacco Use  Smoking Status Not on file  Smokeless Tobacco Not on file    BH Tobacco Counseling     Are you interested in Tobacco Cessation Medications?  No value filed. Counseled patient on smoking cessation:   No value filed. Reason Tobacco Screening Not Completed: No value filed.       Social History:  Social History   Substance and Sexual Activity  Alcohol Use None     Social History   Substance and Sexual Activity  Drug Use Not on file    Additional Social History:                           Allergies:  No Known Allergies Lab Results:  Results for orders placed or performed during the hospital encounter of 06/14/22 (from the past 48 hour(s))  Comprehensive metabolic panel     Status: Abnormal   Collection Time: 06/14/22 12:56 AM  Result Value Ref Range   Sodium 139 135 - 145 mmol/L   Potassium 3.4 (L) 3.5 - 5.1 mmol/L   Chloride 107 98 - 111 mmol/L   CO2 25 22 - 32 mmol/L   Glucose, Bld 106 (H) 70 - 99 mg/dL    Comment: Glucose reference range applies only to samples taken after fasting for at least 8 hours.   BUN 13 6 - 20 mg/dL   Creatinine, Ser 0.98 0.44 - 1.00 mg/dL   Calcium 9.9 8.9 - 11.9 mg/dL   Total Protein 8.0 6.5 - 8.1 g/dL   Albumin 5.0 3.5 - 5.0 g/dL   AST 21 15 - 41 U/L   ALT 18 0 - 44 U/L   Alkaline Phosphatase 53 38 - 126 U/L   Total Bilirubin 0.3 0.3 - 1.2 mg/dL   GFR, Estimated >14 >78 mL/min    Comment: (NOTE) Calculated using the CKD-EPI Creatinine Equation (2021)    Anion gap 7 5 - 15    Comment: Performed at [facility], [address], [place], [place] [address]  Ethanol     Status: None   Collection Time: 06/14/22 12:56 AM  Result Value Ref Range   Alcohol, Ethyl (B) <10 <10 mg/dL    Comment: (NOTE) Lowest detectable limit for serum alcohol is 10 mg/dL.  For medical purposes only. Performed at [facility], [address] [place], [place] [address]   Salicylate level     Status: Abnormal   Collection Time: 06/14/22 12:56 AM  Result Value Ref Range   Salicylate Lvl <7.0 (L) 7.0 - 30.0 mg/dL    Comment: Performed at [facility], [address] [place], [place]  [address]  Acetaminophen level     Status: Abnormal   Collection Time: 06/14/22 12:56 AM  Result Value Ref Range   Acetaminophen (Tylenol), Serum <10 (L) 10 - 30 ug/mL    Comment: (NOTE) Therapeutic concentrations vary significantly. A range of 10-30 ug/mL  may be an effective concentration for many patients. However, some  are best treated at concentrations outside of this range. Acetaminophen concentrations >150 ug/mL at 4 hours after ingestion  and >50 ug/mL at 12 hours after ingestion are often associated with  toxic reactions.  Performed at [facility], [address], [place], [place] [address]   cbc     Status: None   Collection Time: 06/14/22 12:56 AM  Result Value Ref Range   WBC 7.1 4.0 -  10.5 K/uL   RBC 4.65 3.87 - 5.11 MIL/uL   Hemoglobin 13.6 12.0 - 15.0 g/dL   HCT 16.1 09.6 - 04.5 %   MCV 91.4 80.0 - 100.0 fL   MCH 29.2 26.0 - 34.0 pg   MCHC 32.0 30.0 - 36.0 g/dL   RDW 40.9 81.1 - 91.4 %   Platelets 297 150 - 400 K/uL   nRBC 0.0 0.0 - 0.2 %    Comment: Performed at [facility], [address] [place], [place] [address]  Lithium level     Status: Abnormal   Collection Time: 06/14/22 12:56 AM  Result Value Ref Range   Lithium Lvl 0.31 (L) 0.60 - 1.20 mmol/L    Comment: Performed at [facility], [address] [place], [place] [address]  hCG, quantitative, pregnancy     Status: None   Collection Time: 06/14/22 12:56 AM  Result Value Ref Range   hCG, Beta Chain, Quant, S <1 <5 mIU/mL    Comment:          GEST. AGE      CONC.  (mIU/mL)   <=1 WEEK        5 - 50     2 WEEKS       50 - 500     3 WEEKS       100 - 10,000     4 WEEKS     1,000 - 30,000     5 WEEKS     3,500 - 115,000   6-8 WEEKS     12,000 - 270,000    12 WEEKS     15,000 - 220,000        FEMALE AND NON-PREGNANT FEMALE:     LESS THAN 5 mIU/mL Performed at [facility], [address] [place], [place] [address]   I-Stat  beta hCG blood, ED     Status: Abnormal   Collection Time: 06/14/22  1:56 AM  Result Value Ref Range   I-stat hCG, quantitative 7.3 (H) <5 mIU/mL   Comment 3            Comment:   GEST. AGE      CONC.  (mIU/mL)   <=1 WEEK        5 - 50     2 WEEKS       50 - 500     3 WEEKS       100 - 10,000     4 WEEKS     1,000 - 30,000        FEMALE AND NON-PREGNANT FEMALE:     LESS THAN 5 mIU/mL   Rapid urine drug screen (hospital performed)     Status: None   Collection Time: 06/14/22  7:11 AM  Result Value Ref Range   Opiates NONE DETECTED NONE DETECTED   Cocaine NONE DETECTED NONE DETECTED   Benzodiazepines NONE DETECTED NONE DETECTED   Amphetamines NONE DETECTED NONE DETECTED   Tetrahydrocannabinol NONE DETECTED NONE DETECTED   Barbiturates NONE DETECTED NONE DETECTED    Comment: (NOTE) DRUG SCREEN FOR MEDICAL PURPOSES ONLY.  IF CONFIRMATION IS NEEDED FOR ANY PURPOSE, NOTIFY LAB WITHIN 5 DAYS.  LOWEST DETECTABLE LIMITS FOR URINE DRUG SCREEN Drug Class                     Cutoff (ng/mL) Amphetamine and metabolites    1000 Barbiturate and metabolites    200 Benzodiazepine  200 Opiates and metabolites        300 Cocaine and metabolites        300 THC                            50 Performed at [facility], [address] [place], [place] [address]     Blood Alcohol level:  Lab Results  Component Value Date   ETH <10 06/14/2022   ETH <10 06/23/2021    Metabolic Disorder Labs:  Lab Results  Component Value Date   HGBA1C 4.8 06/23/2021   MPG 91.06 06/23/2021   No results found for: "PROLACTIN" Lab Results  Component Value Date   CHOL 205 (H) 06/23/2021   TRIG 67 06/23/2021   HDL 72 06/23/2021   CHOLHDL 2.8 06/23/2021   VLDL 13 06/23/2021   LDLCALC 120 (H) 06/23/2021    Current Medications: Current Facility-Administered Medications  Medication Dose Route Frequency Provider Last Rate Last Admin   acetaminophen (TYLENOL) tablet 650  mg  650 mg Oral Q6H PRN Oneta Rack, NP       alum & mag hydroxide-simeth (MAALOX/MYLANTA) 200-200-20 MG/5ML suspension 30 mL  30 mL Oral Q4H PRN Oneta Rack, NP       cariprazine (VRAYLAR) capsule 1.5 mg  1.5 mg Oral Daily Oneta Rack, NP   1.5 mg at 06/15/22 0801   cariprazine (VRAYLAR) capsule 4.5 mg  4.5 mg Oral Daily Mckenzee Beem, Devainder, MD   4.5 mg at 06/14/22 2129   [START ON 06/16/2022] gabapentin (NEURONTIN) capsule 400 mg  400 mg Oral QHS Rex Kras, MD       [START ON 06/16/2022] lamoTRIgine (LAMICTAL) tablet 50 mg  50 mg Oral Daily Rex Kras, MD       lithium carbonate capsule 300 mg  300 mg Oral TID Rex Kras, MD       ziprasidone (GEODON) injection 20 mg  20 mg Intramuscular Q12H PRN Oneta Rack, NP       And   LORazepam (ATIVAN) tablet 1 mg  1 mg Oral PRN Oneta Rack, NP       magnesium hydroxide (MILK OF MAGNESIA) suspension 30 mL  30 mL Oral Daily PRN Oneta Rack, NP       propranolol (INDERAL) tablet 20 mg  20 mg Oral BID Rex Kras, MD       traZODone (DESYREL) tablet 50 mg  50 mg Oral QHS PRN Oneta Rack, NP       PTA Medications: Medications Prior to Admission  Medication Sig Dispense Refill Last Dose   etonogestrel-ethinyl estradiol (NUVARING) 0.12-0.015 MG/24HR vaginal ring Place 1 each vaginally every 28 (twenty-eight) days.      gabapentin (NEURONTIN) 300 MG capsule Take 300-600 mg by mouth at bedtime as needed.      lamoTRIgine (LAMICTAL) 25 MG tablet Take 25 mg by mouth daily.      lithium 300 MG tablet Take 900 mg by mouth at bedtime.      propranolol (INDERAL) 20 MG tablet Take 20 mg by mouth 2 (two) times daily.      VRAYLAR 4.5 MG CAPS Take 1 capsule by mouth at bedtime.       Musculoskeletal: Strength & Muscle Tone: within normal limits Gait & Station: normal Patient leans: N/A            Psychiatric Specialty Exam:  Presentation  General Appearance:  Appropriate for  Environment  Eye  Contact: Fair  Speech: Clear and Coherent  Speech Volume: Normal  Handedness: Right   Mood and Affect  Mood: Depressed; Anxious  Affect: Appropriate   Thought Process  Thought Processes: Coherent  Duration of Psychotic Symptoms:N/A Past Diagnosis of Schizophrenia or Psychoactive disorder: No  Descriptions of Associations:Intact  Orientation:Full (Time, Place and Person)  Thought Content:Logical  Hallucinations:Hallucinations: None  Ideas of Reference:None  Suicidal Thoughts:Suicidal Thoughts: Yes, Passive SI Passive Intent and/or Plan: Without Plan; Without Intent  Homicidal Thoughts:Homicidal Thoughts: No   Sensorium  Memory: Immediate Fair; Remote Fair; Recent Fair  Judgment: Fair  Insight: Fair   Chartered certified accountant: Fair  Attention Span: Fair  Recall: [employer] of Knowledge: Fair  Language: Fair   Psychomotor Activity  Psychomotor Activity: Psychomotor Activity: Normal   Assets  Assets: Manufacturing systems engineer; Desire for Improvement; Housing   Sleep  Sleep: Sleep: Fair    Physical Exam: Physical Exam Vitals and nursing note reviewed.  Constitutional:      Appearance: Normal appearance.  Neurological:     General: No focal deficit present.     Mental Status: She is alert and oriented to person, place, and time.  Psychiatric:        Mood and Affect: Mood normal.        Behavior: Behavior normal.    Review of Systems  Psychiatric/Behavioral:  Positive for depression and suicidal ideas.   All other systems reviewed and are negative.  Blood pressure 95/69, pulse 89, temperature 98.5 F (36.9 C), temperature source Oral, resp. rate 16, height 5\' 3"  (1.6 m), weight 64.4 kg, last menstrual period 05/30/2022, SpO2 100 %. Body mass index is 25.15 kg/m.  Treatment Plan Summary: Daily contact with patient to assess and evaluate symptoms and progress in treatment and Medication management  Observation  Level/Precautions:  15 minute checks  Laboratory:   As indicated.  Psychotherapy:    Medications: Restart home medications.  Will obtain a lithium level when appropriate.  Consultations: None  Discharge Concerns:    Estimated LOS: 5 to 7 days.  Other:    Current medications: Patient is being restarted back on the following medications: Vraylar 6 mg a day Gabapentin 400 mg at night Lithium carbonate 300 mg 3 times a day Propranolol 20 mg twice a day  Increase lamotrigine to 50 mg a day    Physician Treatment Plan for Primary Diagnosis: MDD (major depressive disorder) Long Term Goal(s): Improvement in symptoms so as ready for discharge  Short Term Goals: Ability to verbalize feelings will improve, Ability to disclose and discuss suicidal ideas, and Ability to identify and develop effective coping behaviors will improve  Physician Treatment Plan for Secondary Diagnosis: Principal Problem:   MDD (major depressive disorder)  Long Term Goal(s): Improvement in symptoms so as ready for discharge  Short Term Goals: Ability to maintain clinical measurements within normal limits will improve and Compliance with prescribed medications will improve  I certify that inpatient services furnished can reasonably be expected to improve the patient's condition.    Rex Kras, MD 5/9/20241:15 PM Total Time Spent in Direct Patient Care:  I personally spent 30 minutes on the unit in direct patient care. The direct patient care time included face-to-face time with the patient, reviewing the patient's chart, communicating with other professionals, and coordinating care. Greater than 50% of this time was spent in counseling or coordinating care with the patient regarding goals of hospitalization, psycho-education, and discharge planning needs.  Rulon Eisenmenger [facility] Psychiatrist

## 2022-06-15 NOTE — Group Note (Signed)
Date:  06/15/2022 Time:  12:44 PM  Group Topic/Focus:  Coping With Mental Health Crisis:   The purpose of this group is to help patients identify strategies for coping with mental health crisis.  Group discusses possible causes of crisis and ways to manage them effectively. Early Warning Signs:   The focus of this group is to help patients identify signs or symptoms they exhibit before slipping into an unhealthy state or crisis. Emotional Education:   The focus of this group is to discuss what feelings/emotions are, and how they are experienced. Managing Feelings:   The focus of this group is to identify what feelings patients have difficulty handling and develop a plan to handle them in a healthier way upon discharge. Wellness Toolbox:   The focus of this group is to discuss various aspects of wellness, balancing those aspects and exploring ways to increase the ability to experience wellness.  Patients will create a wellness toolbox for use upon discharge.    Participation Level:  Minimal  Participation Quality:  Drowsy  Affect:  Lethargic  Cognitive:  Lacking  Insight: Appropriate  Engagement in Group:  Limited  Modes of Intervention:  Discussion, Exploration, Socialization, and Support  Additional Comments:    Memory Dance Emma Holland 06/15/2022, 12:44 PM

## 2022-06-15 NOTE — [facility] Group Notes (Signed)
[facility] Group Notes:  (Nursing/MHT/Case Management/Adjunct)  Date:  06/15/2022  Time:  9:55 PM  Type of Therapy:   Wrap-up group  Participation Level:  Active  Participation Quality:  Appropriate  Affect:  Appropriate  Cognitive:  Appropriate  Insight:  Appropriate  Engagement in Group:  Engaged  Modes of Intervention:  Education  Summary of Progress/Problems: Goal work on D/C, day 5/10.  Emma Holland 06/15/2022, 9:55 PM

## 2022-06-15 NOTE — [facility] Suicide Risk Assessment (Signed)
[facility] Admission Suicide Risk Assessment   Nursing information obtained from:  Patient Demographic factors:  Adolescent or young adult Current Mental Status:  Self-harm thoughts (H/O self harm behavior) Loss Factors:  Decline in physical health Historical Factors:  Impulsivity Risk Reduction Factors:  Employed, Positive social support, Living with another person, especially a relative  Total Time spent with patient: 30 minutes Principal Problem: MDD (major depressive disorder) Diagnosis:  Principal Problem:   MDD (major depressive disorder)  Subjective Data: 27 year old Caucasian female admitted on a voluntary status with depression and suicidal ideations.  Continued Clinical Symptoms:    The "Alcohol Use Disorders Identification Test", Guidelines for Use in Primary Care, Second Edition.  World Science writer [facility]). Score between 0-7:  no or low risk or alcohol related problems. Score between 8-15:  moderate risk of alcohol related problems. Score between 16-19:  high risk of alcohol related problems. Score 20 or above:  warrants further diagnostic evaluation for alcohol dependence and treatment.   CLINICAL FACTORS:   Bipolar Disorder:   Mixed State Depression:   Impulsivity Insomnia   Musculoskeletal: Strength & Muscle Tone: within normal limits Gait & Station: normal Patient leans: N/A  Psychiatric Specialty Exam:  Presentation  General Appearance:  Casual  Eye Contact: Good  Speech: Clear and Coherent  Speech Volume: Normal  Handedness: Right   Mood and Affect  Mood: Anxious  Affect: Congruent   Thought Process  Thought Processes: Coherent  Descriptions of Associations:Circumstantial  Orientation:Full (Time, Place and Person)  Thought Content:WDL  History of Schizophrenia/Schizoaffective disorder:No  Duration of Psychotic Symptoms:No data recorded Hallucinations:No data recorded Ideas of Reference:None  Suicidal Thoughts:No data  recorded Homicidal Thoughts:No data recorded  Sensorium  Memory: Immediate Fair  Judgment: Fair  Insight: Fair   Art therapist  Concentration: Fair  Attention Span: Fair  Recall: Good  Fund of Knowledge: Good  Language: Good   Psychomotor Activity  Psychomotor Activity:No data recorded  Assets  Assets: Desire for Improvement; Resilience   Sleep  Sleep:No data recorded   Physical Exam: Physical Exam Constitutional:      Appearance: Normal appearance.  Neurological:     General: No focal deficit present.     Mental Status: She is alert and oriented to person, place, and time. Mental status is at baseline.    Review of Systems  All other systems reviewed and are negative.  Blood pressure 95/69, pulse 89, temperature 98.5 F (36.9 C), temperature source Oral, resp. rate 16, height 5\' 3"  (1.6 m), weight 64.4 kg, last menstrual period 05/30/2022, SpO2 100 %. Body mass index is 25.15 kg/m.   COGNITIVE FEATURES THAT CONTRIBUTE TO RISK:  Thought constriction (tunnel vision)    SUICIDE RISK:   Moderate:  Frequent suicidal ideation with limited intensity, and duration, some specificity in terms of plans, no associated intent, good self-control, limited dysphoria/symptomatology, some risk factors present, and identifiable protective factors, including available and accessible social support.  PLAN OF CARE:ASSESSMENT:  Diagnoses / Active Problems: Bipolar depression with suicidal ideations  PLAN: Safety and Monitoring:  --  Voluntary admission to inpatient psychiatric unit for safety, stabilization and treatment  -- Daily contact with patient to assess and evaluate symptoms and progress in treatment  -- Patient's case to be discussed in multi-disciplinary team meeting  -- Observation Level : q15 minute checks  -- Vital signs:  q12 hours  -- Precautions: suicide, elopement, and assault  2. Psychiatric Diagnoses and Treatment:    --  The  risks/benefits/side-effects/alternatives to this  medication were discussed in detail with the patient and time was given for questions. The patient consents to medication trial.  -- FDA   -- Metabolic profile and EKG monitoring obtained while on an atypical antipsychotic (BMI: Lipid Panel: HbgA1c: QTc:)    -- Encouraged patient to participate in unit milieu and in scheduled group therapies   -- Short Term Goals: Ability to identify changes in lifestyle to reduce recurrence of condition will improve, Ability to verbalize feelings will improve, Ability to disclose and discuss suicidal ideas, and Ability to maintain clinical measurements within normal limits will improve  -- Long Term Goals: Improvement in symptoms so as ready for discharge    3. Medical Issues Being Addressed:   Tobacco Use Disorder  -- Nicotine patch 21mg /24 hours ordered  -- Smoking cessation encouraged  4. Discharge Planning:   -- Social work and case management to assist with discharge planning and identification of hospital follow-up needs prior to discharge  -- Estimated LOS: 5-7 days  -- Discharge Concerns: Need to establish a safety plan; Medication compliance and effectiveness  -- Discharge Goals: Return home with outpatient referrals for mental health follow-up including medication management/psychotherapy   I certify that inpatient services furnished can reasonably be expected to improve the patient's condition.   Rex Kras, MD 06/15/2022, 1:01 PM

## 2022-06-15 NOTE — Plan of Care (Signed)
  Problem: Education: Goal: Emotional status will improve Outcome: Progressing Goal: Mental status will improve Outcome: Progressing Goal: Verbalization of understanding the information provided will improve Outcome: Progressing   Problem: Activity: Goal: Interest or engagement in activities will improve Outcome: Progressing   Problem: Education: Goal: Utilization of techniques to improve thought processes will improve Outcome: Progressing

## 2022-06-15 NOTE — Progress Notes (Signed)
   06/14/22 1950  Psych Admission Type (Psych Patients Only)  Admission Status Voluntary  Psychosocial Assessment  Patient Complaints Anxiety;Depression  Eye Contact Fair  Facial Expression Anxious  Affect Depressed  Speech Logical/coherent  Interaction Assertive  Motor Activity Slow  Appearance/Hygiene Unremarkable  Behavior Characteristics Cooperative;Appropriate to situation  Mood Despair;Sad;Preoccupied  Thought Process  Coherency Circumstantial  Content Blaming self  Delusions None reported or observed  Perception WDL  Hallucination None reported or observed  Judgment Poor  Confusion None  Danger to Self  Current suicidal ideation? Denies  Danger to Others  Danger to Others None reported or observed

## 2022-06-16 ENCOUNTER — Encounter (HOSPITAL_COMMUNITY): Payer: Self-pay

## 2022-06-16 ENCOUNTER — Encounter (HOSPITAL_COMMUNITY): Payer: Self-pay | Admitting: Psychiatry

## 2022-06-16 MED ORDER — BUPROPION HCL 75 MG PO TABS
75.0000 mg | ORAL_TABLET | Freq: Every day | ORAL | Status: DC
  Administered 2022-06-16 – 2022-06-17 (×2): 75 mg via ORAL
  Filled 2022-06-16 (×4): qty 1

## 2022-06-16 MED ORDER — LITHIUM CARBONATE ER 450 MG PO TBCR
450.0000 mg | EXTENDED_RELEASE_TABLET | Freq: Two times a day (BID) | ORAL | Status: DC
  Administered 2022-06-16 – 2022-06-19 (×6): 450 mg via ORAL
  Filled 2022-06-16 (×10): qty 1

## 2022-06-16 NOTE — [facility] Counselor (Signed)
Adult Comprehensive Assessment  Patient ID: Emma Holland, female   DOB: July 16, 1995, 27 y.o.   MRN: [id number]  Information Source: Information source: Patient  Current Stressors:  Patient states their primary concerns and needs for treatment are:: During assessment, patient states she has been feeling suicidal ideation on and off for the past month, describing rapid mood swings. Endorses depressive symptoms to include, lack of hygiene, inconsistent sleep, lack of motivation, anhedonia, and SI. Does endorse some sexual risk taking behaviors during mood swings, rule out bipolar II d/o. 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 [employer] and describes it as "go go go" Family Relationships: none reported Surveyor, quantity / Lack of resources (include bankruptcy): Reports she is dependent on parents financially Housing / Lack of housing: none reported Physical health (include injuries & life threatening diseases): none reported Social relationships: reports dental issues Substance abuse: alcohol use disorder, unspecified, in early remission Bereavement / Loss: none reported  Living/Environment/Situation:  Living Arrangements: Spouse/significant other Living conditions (as described by patient or guardian): WNL Who else lives in the home?: patient lives with partner How long has patient lived in current situation?: 1.5 years What is atmosphere in current home: Comfortable  Family History:  Marital status: Long term relationship Long term relationship, how long?: 5 yrs What types of issues is patient dealing with in the relationship?: Patient and boyfriend are struggling  to manage their "open" relationship.  Patient admits she has ot been honest about recent connections, but afraid to tell bf. Are you sexually active?: Yes What is your sexual orientation?: Bisexual Does patient have children?: No  Childhood History:  By whom was/is the patient raised?: Mother/father and step-parent Additional childhood history information: biological father died at age 60 Description of patient's relationship with caregiver when they were a child: Generally speaking, (patient gets along well)" with parents as a child Patient's description of current relationship with people who raised him/her: reports relationship continues to be supportive Does patient have siblings?: Yes Number of Siblings: 1 Description of patient's current relationship with siblings: reports she gets along well with her sister, though she can be difficult due to autism Did patient suffer any verbal/emotional/physical/sexual abuse as a child?: No Did patient suffer from severe childhood neglect?: No Has patient ever been sexually abused/assaulted/raped as an adolescent or adult?: No Was the patient ever a victim of a crime or a disaster?: No Witnessed domestic violence?: No Has patient been affected by domestic violence as an adult?: No  Education:  Highest grade of school patient has completed: HS Diploma Currently a Consulting civil engineer?: No Learning disability?: No  Employment/Work Situation:   Employment Situation: Employed Where is Patient Currently Employed?: [employer] How Long has Patient Been Employed?: 1.5 years Are You Satisfied With Your Job?: No Work Stressors: [employer] very stressful Patient's Job has Been Impacted by Current Illness: Yes Describe how Patient's Job has Been Impacted: more depressed Has Patient ever Been in the [employer]?: No  Financial Resources:   Financial resources: Income from employment, Support from parents / caregiver Does patient have a representative payee or guardian?:  No  Alcohol/Substance Abuse:   Social History   Substance and Sexual Activity  Alcohol Use  Not Currently   Comment: prior alcohol binge use, sober since July 2023   Social History   Substance and Sexual Activity  Drug Use Never   Tobacco Use: Not on file  Drugs of Abuse     Component Value Date/Time   LABOPIA NONE DETECTED 06/14/2022 0711   COCAINSCRNUR NONE DETECTED 06/14/2022 0711   LABBENZ NONE DETECTED 06/14/2022 0711   AMPHETMU NONE DETECTED 06/14/2022 0711   THCU NONE DETECTED 06/14/2022 0711   LABBARB NONE DETECTED 06/14/2022 1610     What has been your use of drugs/alcohol within the last 12 months?: alcohol use, sober for past 10 months If attempted suicide, did drugs/alcohol play a role in this?: No Alcohol/Substance Abuse Treatment Hx: Denies past history  Social Support System:   Forensic psychologist System: Production assistant, radio System: lists family and partner as supportive of her mental health and general wellbeing Type of faith/religion: none How does patient's faith help to cope with current illness?: n/a  Leisure/Recreation:   Do You Have Hobbies?: Yes Leisure and Hobbies: states "knit and I am trying to learn to crochet"  Strengths/Needs:   Patient states these barriers may affect/interfere with their treatment: none reported Patient states these barriers may affect their return to the community: none reported Other important information patient would like considered in planning for their treatment: none reported  Discharge Plan:   Currently receiving community mental health services: Yes (From Whom) Charlynne Pander at [facility]) Does patient have access to transportation?: Yes Does patient have financial barriers related to discharge medications?: No ([employer] (private ins)) Will patient be returning to same living situation after discharge?: Yes  Summary/Recommendations:   Summary and Recommendations (to be  completed by the evaluator): 27 y/o female w/ dx of MDD recurrent severe, w/ out psychotic features from [facility] w/ [employer] (private ins) admitted due to worsening suicidal ideation. During assessment, patient states she has been feeling suicidal ideation on and off for the past month, describing rapid mood swings. Endorses depressive symptoms to include, lack of hygiene, inconsistent sleep, lack of motivation, anhedonia, and SI. Does endorse some sexual risk taking behaviors during mood swings, rule out bipolar II d/o. Patient states her goal for hospitalization is to recieve medication management, group theraoy, "I want to not die all the time and feel more at ease with what is going on in my life." Therapeutic recommendations include further crisis stabilization, medication management, group therapy, and case management.  Corky Crafts. 06/16/2022

## 2022-06-16 NOTE — Progress Notes (Signed)
   06/16/22 1950  Psych Admission Type (Psych Patients Only)  Admission Status Voluntary  Psychosocial Assessment  Eye Contact Fair  Facial Expression Anxious  Affect Depressed  Speech Logical/coherent  Interaction Assertive  Motor Activity Slow  Appearance/Hygiene Unremarkable  Behavior Characteristics Cooperative;Appropriate to situation  Mood Pleasant  Thought Process  Coherency Circumstantial  Content Blaming self  Delusions None reported or observed  Perception WDL  Hallucination None reported or observed  Judgment Poor  Confusion None  Danger to Self  Current suicidal ideation? Denies  Danger to Others  Danger to Others None reported or observed

## 2022-06-16 NOTE — Plan of Care (Signed)
  Problem: Education: Goal: Mental status will improve Outcome: Progressing Goal: Verbalization of understanding the information provided will improve Outcome: Progressing   Problem: Activity: Goal: Interest or engagement in activities will improve Outcome: Progressing Goal: Sleeping patterns will improve Outcome: Progressing   Problem: Coping: Goal: Ability to verbalize frustrations and anger appropriately will improve Outcome: Progressing Goal: Ability to demonstrate self-control will improve Outcome: Progressing   Problem: Health Behavior/Discharge Planning: Goal: Identification of resources available to assist in meeting health care needs will improve Outcome: Progressing Goal: Compliance with treatment plan for underlying cause of condition will improve Outcome: Progressing   

## 2022-06-16 NOTE — Group Note (Signed)
Date:  06/16/2022 Time:  4:23 PM  Group Topic/Focus:  Dimensions of Wellness:   The focus of this group is to introduce the topic of wellness and discuss the role each dimension of wellness plays in total health.    Participation Level:  Active  Participation Quality:  Appropriate  Affect:  Appropriate  Cognitive:  Appropriate  Insight: Appropriate  Engagement in Group:  Engaged  Modes of Intervention:  Activity and Education  Additional Comments:   Pt attended and participated in the Physical Wellness Education group.  [name] [name] Phylis Javed 06/16/2022, 4:23 PM

## 2022-06-16 NOTE — [facility] Suicide Risk Assessment (Signed)
[facility] INPATIENT:  Family/Significant Other Suicide Prevention Education  Suicide Prevention Education:  Patient Refusal for Family/Significant Other Suicide Prevention Education: The patient Emma Holland has refused to provide written consent for family/significant other to be provided Family/Significant Other Suicide Prevention Education during admission and/or prior to discharge.  Physician notified.  Lisle Skillman S Blakleigh Straw 06/16/2022, 1:25 PM

## 2022-06-16 NOTE — Group Note (Signed)
Date:  06/16/2022 Time:  10:01 AM  Group Topic/Focus:  Goals Group:   The focus of this group is to help patients establish daily goals to achieve during treatment and discuss how the patient can incorporate goal setting into their daily lives to aide in recovery. Orientation:   The focus of this group is to educate the patient on the purpose and policies of crisis stabilization and provide a format to answer questions about their admission.  The group details unit policies and expectations of patients while admitted.    Participation Level:  Active  Participation Quality:  Appropriate  Affect:  Appropriate  Cognitive:  Appropriate  Insight: Appropriate  Engagement in Group:  Engaged  Modes of Intervention:  Activity and Discussion  Additional Comments:   Pt attended and participated in the Orientation/Goals group. Pt completed the "My Daily Goal" worksheet. Pt goal is to attend group. Pt plans to get out of bed, get dressed and go to the group room to help reach this goal.  Emma Holland 06/16/2022, 10:01 AM

## 2022-06-16 NOTE — Progress Notes (Signed)
Psychiatric progress note.  Patient Identification: Emma Holland MRN:  [id number] Date of Evaluation:  06/16/2022 Chief Complaint:  MDD (major depressive disorder) [F32.9] Principal Diagnosis: MDD (major depressive disorder) Diagnosis:  Principal Problem:   MDD (major depressive disorder)   Reason for admission   The patient is a 27 year old Caucasian female was admitted on a voluntary status for symptoms of depression and suicidal ideations in the context of medication noncompliance. Chart review from last 24 hours   Nursing staff reports medication compliance.  No side effects are noted.  Trazodone 50 mg at bedtime was given as as needed. No behavioral issues noted. She continues to contract for safety.  Yesterday, the psychiatry team made the following recommendations:  Restart home medication. Titrate lamotrigine to 50 mg a day.  Information obtained during interview   The patient was seen and evaluated and chart was reviewed and the case was discussed with the treatment team.  Medications were discussed and clarified.  Patient reports that she is taking Vraylar 4.5 mg a day.  She also prefers to take all of the gabapentin once at night.  She has been taking the lithium also on at night because of her noncompliance.  Discussed lithium dosing and agreed to change this to Lithobid twice a day.  Patient also reports that she did fair on SSRIs but may have had a manic episode with one of them. When seen today she is alert oriented and cooperative and continues to endorse depression and passive suicidal ideations.  She is able to contract for safety.  She denied any auditory or visual hallucinations.  No paranoia was noted.    Associated Signs/Symptoms: Depression Symptoms:  depressed mood, psychomotor retardation, difficulty concentrating, hopelessness, suicidal thoughts with specific plan, anxiety, (Hypo) Manic Symptoms:  Distractibility, Impulsivity, Irritable Mood, Anxiety  Symptoms:  Social Anxiety, Psychotic Symptoms:   No psychosis noted. PTSD Symptoms: Negative Total Time spent with patient: 30 minutes  Past Psychiatric History: Patient is being seen at the [facility] for medications for bipolar disorder.  She reports that she has also been diagnosed as ADHD and anxiety.  Is the patient at risk to self? Yes.    Has the patient been a risk to self in the past 6 months? No.  Has the patient been a risk to self within the distant past? No.  Is the patient a risk to others? No.  Has the patient been a risk to others in the past 6 months? No.  Has the patient been a risk to others within the distant past? No.   [place] Scale:  Flowsheet Row [facility] (Current[facility] Most recent reading at 06/14/2022  7:00 PM ED from 06/14/2022 in [facility] Department at [facility] Most recent reading at 06/14/2022 12:34 AM ED from 05/22/2022 in [facility] Most recent reading at 05/22/2022 10:14 PM  C-SSRS RISK CATEGORY High Risk Moderate Risk No Risk        Prior Inpatient Therapy: No. If yes, describe patient reports that she has never been hospitalized in the past. Prior Outpatient Therapy: Yes.   If yes, describe patient reports that she is seeing a therapist and a Veterinary surgeon.  Alcohol Screening:   Substance Abuse History in the last 12 months:  No. Consequences of Substance Abuse: Negative Previous Psychotropic Medications: Yes  Psychological Evaluations: No  Past Medical History: History reviewed. No pertinent past medical history. History reviewed. No  pertinent surgical history. Family History: History reviewed. No pertinent family history. Family Psychiatric  History: Unknown at this time. Tobacco Screening:  Social History   Tobacco Use  Smoking Status Unknown  Smokeless Tobacco Not on file    BH Tobacco Counseling     Are you  interested in Tobacco Cessation Medications?  No value filed. Counseled patient on smoking cessation:  No value filed. Reason Tobacco Screening Not Completed: No value filed.       Social History:  Social History   Substance and Sexual Activity  Alcohol Use Not Currently   Comment: prior alcohol binge use, sober since July 2023     Social History   Substance and Sexual Activity  Drug Use Never    Additional Social History: Marital status: Long term relationship Long term relationship, how long?: 5 yrs What types of issues is patient dealing with in the relationship?: Patient and boyfriend are struggling to manage their "open" relationship.  Patient admits she has ot been honest about recent connections, but afraid to tell bf. Are you sexually active?: Yes What is your sexual orientation?: Bisexual Does patient have children?: No                         Allergies:  No Known Allergies Lab Results:  No results found for this or any previous visit (from the past 48 hour(s)).   Blood Alcohol level:  Lab Results  Component Value Date   ETH <10 06/14/2022   ETH <10 06/23/2021    Metabolic Disorder Labs:  Lab Results  Component Value Date   HGBA1C 4.8 06/23/2021   MPG 91.06 06/23/2021   No results found for: "PROLACTIN" Lab Results  Component Value Date   CHOL 205 (H) 06/23/2021   TRIG 67 06/23/2021   HDL 72 06/23/2021   CHOLHDL 2.8 06/23/2021   VLDL 13 06/23/2021   LDLCALC 120 (H) 06/23/2021    Current Medications: Current Facility-Administered Medications  Medication Dose Route Frequency Provider Last Rate Last Admin   acetaminophen (TYLENOL) tablet 650 mg  650 mg Oral Q6H PRN Oneta Rack, NP       alum & mag hydroxide-simeth (MAALOX/MYLANTA) 200-200-20 MG/5ML suspension 30 mL  30 mL Oral Q4H PRN Oneta Rack, NP       cariprazine (VRAYLAR) capsule 4.5 mg  4.5 mg Oral Daily Itxel Wickard, Devainder, MD   4.5 mg at 06/15/22 2100   gabapentin (NEURONTIN)  capsule 400 mg  400 mg Oral QHS Rex Kras, MD       lamoTRIgine (LAMICTAL) tablet 50 mg  50 mg Oral Daily Rex Kras, MD   50 mg at 06/16/22 0740   lithium carbonate (ESKALITH) ER tablet 450 mg  450 mg Oral Q12H Aalijah Mims, MD       ziprasidone (GEODON) injection 20 mg  20 mg Intramuscular Q12H PRN Oneta Rack, NP       And   LORazepam (ATIVAN) tablet 1 mg  1 mg Oral PRN Oneta Rack, NP       magnesium hydroxide (MILK OF MAGNESIA) suspension 30 mL  30 mL Oral Daily PRN Oneta Rack, NP       propranolol (INDERAL) tablet 20 mg  20 mg Oral BID Rex Kras, MD   20 mg at 06/16/22 0740   traZODone (DESYREL) tablet 50 mg  50 mg Oral QHS PRN Oneta Rack, NP   50 mg at 06/15/22 2100   PTA  Medications: Medications Prior to Admission  Medication Sig Dispense Refill Last Dose   etonogestrel-ethinyl estradiol (NUVARING) 0.12-0.015 MG/24HR vaginal ring Place 1 each vaginally every 28 (twenty-eight) days.      gabapentin (NEURONTIN) 300 MG capsule Take 300-600 mg by mouth at bedtime as needed.      lamoTRIgine (LAMICTAL) 25 MG tablet Take 25 mg by mouth daily.      lithium 300 MG tablet Take 900 mg by mouth at bedtime.      propranolol (INDERAL) 20 MG tablet Take 20 mg by mouth 2 (two) times daily.      VRAYLAR 4.5 MG CAPS Take 1 capsule by mouth at bedtime.       Musculoskeletal: Strength & Muscle Tone: within normal limits Gait & Station: normal Patient leans: N/A            Psychiatric Specialty Exam:  Presentation  General Appearance:  Appropriate for Environment  Eye Contact: Fair  Speech: Clear and Coherent  Speech Volume: Decreased  Handedness: Right   Mood and Affect  Mood: Anxious; Depressed  Affect: Constricted; Depressed   Thought Process  Thought Processes: Coherent  Duration of Psychotic Symptoms:N/A Past Diagnosis of Schizophrenia or Psychoactive disorder: No  Descriptions of  Associations:Intact  Orientation:Full (Time, Place and Person)  Thought Content:Logical  Hallucinations:Hallucinations: None  Ideas of Reference:None  Suicidal Thoughts:Suicidal Thoughts: Yes, Passive SI Passive Intent and/or Plan: Without Intent; Without Plan  Homicidal Thoughts:Homicidal Thoughts: No   Sensorium  Memory: Immediate Fair; Remote Fair; Recent Fair  Judgment: Fair  Insight: Fair   Art therapist  Concentration: Fair  Attention Span: Fair  Recall: [employer] of Knowledge: Fair  Language: Fair   Psychomotor Activity  Psychomotor Activity: Psychomotor Activity: Normal   Assets  Assets: Desire for Improvement; Communication Skills   Sleep  Sleep: Sleep: Fair    Physical Exam: Physical Exam Vitals and nursing note reviewed.  Constitutional:      Appearance: Normal appearance.  Neurological:     General: No focal deficit present.     Mental Status: She is alert and oriented to person, place, and time.  Psychiatric:        Mood and Affect: Mood normal.        Behavior: Behavior normal.    Review of Systems  Psychiatric/Behavioral:  Positive for depression and suicidal ideas. The patient is nervous/anxious.   All other systems reviewed and are negative.  Blood pressure 101/71, pulse 91, temperature 98.7 F (37.1 C), temperature source Oral, resp. rate 16, height 5\' 3"  (1.6 m), weight 64.4 kg, last menstrual period 05/30/2022, SpO2 100 %. Body mass index is 25.15 kg/m.  Treatment Plan Summary: Daily contact with patient to assess and evaluate symptoms and progress in treatment and Medication management  Observation Level/Precautions:  15 minute checks  Laboratory:   As indicated.  Psychotherapy:    Medications: Restart home medications.  Will obtain a lithium level when appropriate.  Consultations: None  Discharge Concerns:    Estimated LOS: Possibly by next Wednesday.  Other:    Current medications: Patient is  being restarted back on the following medications: Vraylar changed to 4.5 mg a day on 06/16/2022 Gabapentin 400 mg at night Lithobid 450 mg twice a day Propranolol 20 mg twice a day  lamotrigine to 50 mg a day Consider a trial of Wellbutrin 75 mg a day   Physician Treatment Plan for Primary Diagnosis: MDD (major depressive disorder) Long Term Goal(s): Improvement in symptoms so as ready  for discharge  Short Term Goals: Ability to verbalize feelings will improve, Ability to disclose and discuss suicidal ideas, and Ability to identify and develop effective coping behaviors will improve  Physician Treatment Plan for Secondary Diagnosis: Principal Problem:   MDD (major depressive disorder)  Long Term Goal(s): Improvement in symptoms so as ready for discharge  Short Term Goals: Ability to maintain clinical measurements within normal limits will improve and Compliance with prescribed medications will improve  I certify that inpatient services furnished can reasonably be expected to improve the patient's condition.    Rex Kras, MD 5/10/202411:20 AM Total Time Spent in Direct Patient Care:  I personally spent 30 minutes on the unit in direct patient care. The direct patient care time included face-to-face time with the patient, reviewing the patient's chart, communicating with other professionals, and coordinating care. Greater than 50% of this time was spent in counseling or coordinating care with the patient regarding goals of hospitalization, psycho-education, and discharge planning needs.   Javontay Vandam ZOXW,RU,EAV,[id number] Psychiatrist Patient ID: Rollene Fare, female   DOB: 1995/03/19, 27 y.o.   MRN: [id number]

## 2022-06-16 NOTE — Group Note (Deleted)
Date:  06/16/2022 Time:  2:30 PM  Group Topic/Focus:  Goals Group:   The focus of this group is to help patients establish daily goals to achieve during treatment and discuss how the patient can incorporate goal setting into their daily lives to aide in recovery. Orientation:   The focus of this group is to educate the patient on the purpose and policies of crisis stabilization and provide a format to answer questions about their admission.  The group details unit policies and expectations of patients while admitted.     Participation Level:  {[facility] PARTICIPATION LEVEL:22264}  Participation Quality:  {[facility] PARTICIPATION QUALITY:22265}  Affect:  {[facility] AFFECT:22266}  Cognitive:  {[facility] COGNITIVE:22267}  Insight: {B[facility] Insight2:20797}  Engagement in Group:  {[facility] ENGAGEMENT IN GROUP:22268}  Modes of Intervention:  {[facility] MODES OF INTERVENTION:22269}  Additional Comments:  ***  Hally Colella M Xaivier Malay 06/16/2022, 2:30 PM  

## 2022-06-16 NOTE — Group Note (Signed)
Recreation Therapy Group Note   Group Topic:Other  Group Date: 06/16/2022 Start Time: 1300 End Time: 1345 Facilitators: Amador Braddy-McCall, LRT,CTRS Location: [address] Dayroom   Activity Description/Intervention: Therapeutic Drumming. Patients with peers and staff were given the opportunity to engage in a leader facilitated [facility] Group Empowerment Drumming Circle with staff from the [employer], in partnership with [employer]. Teaching laboratory technician and trained [employer], Theodoro Doing leading with LRT observing and documenting intervention and pt response. This evidenced-based practice targets 7 areas of health and wellbeing in the human experience including: stress-reduction, exercise, self-expression, camaraderie/support, nurturing, spirituality, and music-making (leisure).   Goal Area(s) Addresses:  Patient will engage in pro-social way in music group.  Patient will follow directions of drum leader on the first prompt. Patient will demonstrate no behavioral issues during group.  Patient will identify if a reduction in stress level occurs as a result of participation in therapeutic drum circle.      Affect/Mood: Appropriate   Participation Level: Engaged   Participation Quality: Independent   Behavior: Appropriate   Speech/Thought Process: Focused   Insight: Good   Judgement: Good   Modes of Intervention: Teaching laboratory technician   Patient Response to Interventions:  Engaged   Education Outcome:  Acknowledges education   Clinical Observations/Individualized Feedback: Deztini actively engaged in therapeutic drumming exercise and discussions. Pt was appropriate with peers, staff, and musical equipment for duration of programming.  Pt identified "tired" as their feeling after participation in music-based programming. Pt affect congruent/incongruent with verbalized emotion.    Plan: Continue to engage patient in RT group sessions  2-3x/week.   Tyshay Adee-McCall, LRT,CTRS 06/16/2022 1:55 PM

## 2022-06-16 NOTE — [facility] Group Notes (Signed)
[facility] Group Notes:  (Nursing/MHT/Case Management/Adjunct)  Date:  06/16/2022  Time:  8:42 PM  Type of Therapy:   AA group  Participation Level:  Active  Participation Quality:  Appropriate  Affect:  Appropriate  Cognitive:  Appropriate  Insight:  Appropriate  Engagement in Group:  Engaged  Modes of Intervention:  Education  Summary of Progress/Problems: Attended AA group.  Emma Holland 06/16/2022, 8:42 PM

## 2022-06-16 NOTE — BH IP Treatment Plan (Signed)
Interdisciplinary Treatment and Diagnostic Plan   06/16/2022 Time of Session: 1030 Emma Holland MRN: [id number]  Principal Diagnosis: MDD (major depressive disorder)  Secondary Diagnoses: Principal Problem:   MDD (major depressive disorder)   Current Medications:  Current Facility-Administered Medications  Medication Dose Route Frequency Provider Last Rate Last Admin   acetaminophen (TYLENOL) tablet 650 mg  650 mg Oral Q6H PRN Oneta Rack, NP       alum & mag hydroxide-simeth (MAALOX/MYLANTA) 200-200-20 MG/5ML suspension 30 mL  30 mL Oral Q4H PRN Oneta Rack, NP       buPROPion [facility]) tablet 75 mg  75 mg Oral Daily Rex Kras, MD   75 mg at 06/16/22 1203   cariprazine (VRAYLAR) capsule 4.5 mg  4.5 mg Oral Daily Goli, Devainder, MD   4.5 mg at 06/15/22 2100   gabapentin (NEURONTIN) capsule 400 mg  400 mg Oral QHS Rex Kras, MD       lamoTRIgine (LAMICTAL) tablet 50 mg  50 mg Oral Daily Rex Kras, MD   50 mg at 06/16/22 0740   lithium carbonate (ESKALITH) ER tablet 450 mg  450 mg Oral Q12H Goli, Rulon Eisenmenger, MD       ziprasidone (GEODON) injection 20 mg  20 mg Intramuscular Q12H PRN Oneta Rack, NP       And   LORazepam (ATIVAN) tablet 1 mg  1 mg Oral PRN Oneta Rack, NP       magnesium hydroxide (MILK OF MAGNESIA) suspension 30 mL  30 mL Oral Daily PRN Oneta Rack, NP       propranolol (INDERAL) tablet 20 mg  20 mg Oral BID Rex Kras, MD   20 mg at 06/16/22 0740   traZODone (DESYREL) tablet 50 mg  50 mg Oral QHS PRN Oneta Rack, NP   50 mg at 06/15/22 2100   PTA Medications: Medications Prior to Admission  Medication Sig Dispense Refill Last Dose   etonogestrel-ethinyl estradiol (NUVARING) 0.12-0.015 MG/24HR vaginal ring Place 1 each vaginally every 28 (twenty-eight) days.      gabapentin (NEURONTIN) 300 MG capsule Take 300-600 mg by mouth at bedtime as needed.      lamoTRIgine (LAMICTAL) 25 MG tablet Take 25 mg by mouth  daily.      lithium 300 MG tablet Take 900 mg by mouth at bedtime.      propranolol (INDERAL) 20 MG tablet Take 20 mg by mouth 2 (two) times daily.      VRAYLAR 4.5 MG CAPS Take 1 capsule by mouth at bedtime.       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   Physician Treatment Plan for Primary Diagnosis: MDD (major depressive disorder) Long Term Goal(s): Improvement in symptoms so as ready for discharge   Short Term Goals: Ability to maintain clinical measurements within normal limits will improve Compliance with prescribed medications will improve Ability to verbalize feelings will improve Ability to disclose and discuss suicidal ideas Ability to identify and develop effective coping behaviors will improve  Medication Management: Evaluate patient's response, side effects, and tolerance of medication regimen.  Therapeutic Interventions: 1 to 1 sessions, Unit Group sessions and Medication administration.  Evaluation of Outcomes: Progressing  Physician Treatment Plan for  Secondary Diagnosis: Principal Problem:   MDD (major depressive disorder)  Long Term Goal(s): Improvement in symptoms so as ready for discharge   Short Term Goals: Ability to maintain clinical measurements within normal limits will improve Compliance with prescribed medications will improve Ability to verbalize feelings will improve Ability to disclose and discuss suicidal ideas Ability to identify and develop effective coping behaviors will improve     Medication Management: Evaluate patient's response, side effects, and tolerance of medication regimen.  Therapeutic Interventions: 1 to 1  sessions, Unit Group sessions and Medication administration.  Evaluation of Outcomes: Progressing   RN Treatment Plan for Primary Diagnosis: MDD (major depressive disorder) Long Term Goal(s): Knowledge of disease and therapeutic regimen to maintain health will improve  Short Term Goals: Ability to remain free from injury will improve, Ability to verbalize frustration and anger appropriately will improve, Ability to demonstrate self-control, Ability to participate in decision making will improve, Ability to verbalize feelings will improve, Ability to disclose and discuss suicidal ideas, Ability to identify and develop effective coping behaviors will improve, and Compliance with prescribed medications will improve  Medication Management: RN will administer medications as ordered by provider, will assess and evaluate patient's response and provide education to patient for prescribed medication. RN will report any adverse and/or side effects to prescribing provider.  Therapeutic Interventions: 1 on 1 counseling sessions, Psychoeducation, Medication administration, Evaluate responses to treatment, Monitor vital signs and CBGs as ordered, Perform/monitor CIWA, COWS, AIMS and Fall Risk screenings as ordered, Perform wound care treatments as ordered.  Evaluation of Outcomes: Progressing   LCSW Treatment Plan for Primary Diagnosis: MDD (major depressive disorder) Long Term Goal(s): Safe transition to appropriate next level of care at discharge, Engage patient in therapeutic group addressing interpersonal concerns.  Short Term Goals: Engage patient in aftercare planning with referrals and resources, Increase social support, Increase ability to appropriately verbalize feelings, Increase emotional regulation, Facilitate acceptance of mental health diagnosis and concerns, Facilitate patient progression through stages of change regarding substance use diagnoses and concerns, Identify triggers associated with  mental health/substance abuse issues, and Increase skills for wellness and recovery  Therapeutic Interventions: Assess for all discharge needs, 1 to 1 time with Social worker, Explore available resources and support systems, Assess for adequacy in community support network, Educate family and significant other(s) on suicide prevention, Complete Psychosocial Assessment, Interpersonal group therapy.  Evaluation of Outcomes: Progressing   Progress in Treatment: Attending groups: Yes. Participating in groups: Yes. Taking medication as prescribed: Yes. Toleration medication: Yes. Family/Significant other contact made: No, will contact:  Pt declined consents Patient understands diagnosis: Yes. Discussing patient identified problems/goals with staff: Yes. Medical problems stabilized or resolved: Yes. Denies suicidal/homicidal ideation: Yes Issues/concerns per patient self-inventory: No. Other: N/A  New problem(s) identified: No, Describe:  None Known  New Short Term/Long Term Goal(s): medication stabilization, elimination of SI thoughts, development of comprehensive mental wellness plan.   Patient Goals:  Coping Skills  Discharge Plan or Barriers:  Reason for Continuation of Hospitalization: : Patient recently admitted. CSW will continue to follow and assess for appropriate referrals and possible discharge planning.   Depression Medication stabilization Suicidal ideation  Estimated Length of Stay: 3-7 Days  Last 3 [place] Suicide Severity Risk Score: Flowsheet Row [facility] Most recent reading at 06/14/2022  7:00 PM ED from 06/14/2022 in [facility] Emergency Department at [facility] Most recent reading at 06/14/2022 12:34 AM ED from 05/22/2022 in [place]  [facility] Most recent reading at 05/22/2022 10:14 PM  C-SSRS RISK CATEGORY High Risk Moderate Risk No Risk       Last PHQ 2/9  Scores:     No data to display          medication stabilization, elimination of SI thoughts, development of comprehensive mental wellness plan.   Scribe for Treatment Team: Ane Payment, LCSW 06/16/2022 2:01 PM

## 2022-06-17 NOTE — Progress Notes (Signed)
Psychiatric progress note.  Patient Identification: EULIA CHAPELL MRN:  [id number] Date of Evaluation:  06/17/2022 Chief Complaint:  MDD (major depressive disorder) [F32.9] Principal Diagnosis: MDD (major depressive disorder) Diagnosis:  Principal Problem:   MDD (major depressive disorder)   Reason for admission   The patient is a 27 year old Caucasian female was admitted on a voluntary status for symptoms of depression and suicidal ideations in the context of medication noncompliance. Chart review from last 24 hours   Nursing staff reports medication compliance.  No side effects are noted.  Trazodone 50 mg at bedtime was given as as needed. No behavioral issues noted.  Continues to contract for safety She slept well last night.  Yesterday, the psychiatry team made the following recommendations:  Continue home medications. Titrate lamotrigine to 50 mg a day. Begin Wellbutrin 75 mg a day Information obtained during interview   The patient was seen and evaluated and the case was discussed with the treatment team.  Patient reports fairly rapid mood swings which seem to occur almost daily when she is not on medications.  She reports that her symptoms are slowly improving and that the mood swings are not as rapid.  She is currently being titrated on the lamotrigine and has agreed to take the Lithobid twice a day.  Level is pending at this time.  Given a past history of possible mania on SSRIs, patient agreed to take Wellbutrin which was started yesterday.  Her first dose will be this morning. Today she is alert oriented and cooperative and is compliant with medication and is contracting for safety.  No active SI/HI/AVH noted.   Associated Signs/Symptoms: Depression Symptoms:  depressed mood, psychomotor retardation, difficulty concentrating, hopelessness, suicidal thoughts with specific plan, anxiety, (Hypo) Manic Symptoms:  Distractibility, Impulsivity, Irritable Mood, Anxiety  Symptoms:  Social Anxiety, Psychotic Symptoms:   No psychosis noted. PTSD Symptoms: Negative Total Time spent with patient: 30 minutes  Past Psychiatric History: Patient is being seen at [facility] for medications for bipolar disorder.  She reports that she has also been diagnosed as ADHD and anxiety.  Is the patient at risk to self? Yes.    Has the patient been a risk to self in the past 6 months? No.  Has the patient been a risk to self within the distant past? No.  Is the patient a risk to others? No.  Has the patient been a risk to others in the past 6 months? No.  Has the patient been a risk to others within the distant past? No.   [place] Scale:  Flowsheet Row Admission (Current) [facility] Most recent reading at 06/14/2022  7:00 PM ED from 06/14/2022 in [facility] Emergency Department at [facility] Most recent reading at 06/14/2022 12:34 AM ED from 05/22/2022 in [facility] Most recent reading at 05/22/2022 10:14 PM  C-SSRS RISK CATEGORY High Risk Moderate Risk No Risk        Prior Inpatient Therapy: No. If yes, describe patient reports that she has never been hospitalized in the past. Prior Outpatient Therapy: Yes.   If yes, describe patient reports that she is seeing a therapist and a Veterinary surgeon.  Alcohol Screening:   Substance Abuse History in the last 12 months:  No. Consequences of Substance Abuse: Negative Previous Psychotropic Medications: Yes  Psychological Evaluations: No  Past Medical History: History reviewed. No pertinent past medical history. History reviewed. No pertinent surgical history.  Family History: History reviewed. No pertinent family history. Family Psychiatric  History: Unknown at this time. Tobacco Screening:  Social History   Tobacco Use  Smoking Status Unknown  Smokeless Tobacco Not on file    BH Tobacco Counseling     Are you  interested in Tobacco Cessation Medications?  No value filed. Counseled patient on smoking cessation:  No value filed. Reason Tobacco Screening Not Completed: No value filed.       Social History:  Social History   Substance and Sexual Activity  Alcohol Use Not Currently   Comment: prior alcohol binge use, sober since July 2023     Social History   Substance and Sexual Activity  Drug Use Never    Additional Social History: Marital status: Long term relationship Long term relationship, how long?: 5 yrs What types of issues is patient dealing with in the relationship?: Patient and boyfriend are struggling to manage their "open" relationship.  Patient admits she has ot been honest about recent connections, but afraid to tell bf. Are you sexually active?: Yes What is your sexual orientation?: Bisexual Does patient have children?: No                         Allergies:  No Known Allergies Lab Results:  No results found for this or any previous visit (from the past 48 hour(s)).   Blood Alcohol level:  Lab Results  Component Value Date   ETH <10 06/14/2022   ETH <10 06/23/2021    Metabolic Disorder Labs:  Lab Results  Component Value Date   HGBA1C 4.8 06/23/2021   MPG 91.06 06/23/2021   No results found for: "PROLACTIN" Lab Results  Component Value Date   CHOL 205 (H) 06/23/2021   TRIG 67 06/23/2021   HDL 72 06/23/2021   CHOLHDL 2.8 06/23/2021   VLDL 13 06/23/2021   LDLCALC 120 (H) 06/23/2021    Current Medications: Current Facility-Administered Medications  Medication Dose Route Frequency Provider Last Rate Last Admin   acetaminophen (TYLENOL) tablet 650 mg  650 mg Oral Q6H PRN Oneta Rack, NP       alum & mag hydroxide-simeth (MAALOX/MYLANTA) 200-200-20 MG/5ML suspension 30 mL  30 mL Oral Q4H PRN Oneta Rack, NP       buPROPion [facility]) tablet 75 mg  75 mg Oral Daily Rex Kras, MD   75 mg at 06/17/22 1610   cariprazine (VRAYLAR)  capsule 4.5 mg  4.5 mg Oral Daily Loucinda Croy, Devainder, MD   4.5 mg at 06/16/22 2116   gabapentin (NEURONTIN) capsule 400 mg  400 mg Oral QHS Rex Kras, MD   400 mg at 06/16/22 2117   lamoTRIgine (LAMICTAL) tablet 50 mg  50 mg Oral Daily Rex Kras, MD   50 mg at 06/17/22 0742   lithium carbonate (ESKALITH) ER tablet 450 mg  450 mg Oral Q12H Rex Kras, MD   450 mg at 06/17/22 0742   ziprasidone (GEODON) injection 20 mg  20 mg Intramuscular Q12H PRN Oneta Rack, NP       And   LORazepam (ATIVAN) tablet 1 mg  1 mg Oral PRN Oneta Rack, NP       magnesium hydroxide (MILK OF MAGNESIA) suspension 30 mL  30 mL Oral Daily PRN Oneta Rack, NP       propranolol (INDERAL) tablet 20 mg  20 mg Oral BID Rex Kras, MD   20 mg at 06/17/22 [phone]  traZODone (DESYREL) tablet 50 mg  50 mg Oral QHS PRN Oneta Rack, NP   50 mg at 06/15/22 2100   PTA Medications: Medications Prior to Admission  Medication Sig Dispense Refill Last Dose   etonogestrel-ethinyl estradiol (NUVARING) 0.12-0.015 MG/24HR vaginal ring Place 1 each vaginally every 28 (twenty-eight) days.      gabapentin (NEURONTIN) 300 MG capsule Take 300-600 mg by mouth at bedtime as needed.      lamoTRIgine (LAMICTAL) 25 MG tablet Take 25 mg by mouth daily.      lithium 300 MG tablet Take 900 mg by mouth at bedtime.      propranolol (INDERAL) 20 MG tablet Take 20 mg by mouth 2 (two) times daily.      VRAYLAR 4.5 MG CAPS Take 1 capsule by mouth at bedtime.       Musculoskeletal: Strength & Muscle Tone: within normal limits Gait & Station: normal Patient leans: N/A            Psychiatric Specialty Exam:  Presentation  General Appearance:  Appropriate for Environment; Casual  Eye Contact: Fair  Speech: Clear and Coherent  Speech Volume: Normal  Handedness: Right   Mood and Affect  Mood: Anxious  Affect: Restricted   Thought Process  Thought Processes: Coherent  Duration of  Psychotic Symptoms:N/A Past Diagnosis of Schizophrenia or Psychoactive disorder: No  Descriptions of Associations:Intact  Orientation:Full (Time, Place and Person)  Thought Content:Logical  Hallucinations:Hallucinations: None  Ideas of Reference:None  Suicidal Thoughts:Suicidal Thoughts: No SI Passive Intent and/or Plan: Without Intent; Without Plan  Homicidal Thoughts:Homicidal Thoughts: No   Sensorium  Memory: Immediate Fair; Remote Fair; Recent Fair  Judgment: Fair  Insight: Fair   Chartered certified accountant: Fair  Attention Span: Fair  Recall: [employer] of Knowledge: Fair  Language: Fair   Psychomotor Activity  Psychomotor Activity: Psychomotor Activity: Normal   Assets  Assets: Manufacturing systems engineer; Desire for Improvement   Sleep  Sleep: Sleep: Fair    Physical Exam: Physical Exam Vitals and nursing note reviewed.  Constitutional:      Appearance: Normal appearance.  Neurological:     General: No focal deficit present.     Mental Status: She is alert and oriented to person, place, and time.  Psychiatric:        Mood and Affect: Mood normal.        Behavior: Behavior normal.    Review of Systems  Psychiatric/Behavioral:  Positive for depression and suicidal ideas. The patient is nervous/anxious.   All other systems reviewed and are negative.  Blood pressure 93/65, pulse 82, temperature 98.4 F (36.9 C), temperature source Oral, resp. rate 18, height 5\' 3"  (1.6 m), weight 64.4 kg, last menstrual period 05/30/2022, SpO2 100 %. Body mass index is 25.15 kg/m.  Treatment Plan Summary: Daily contact with patient to assess and evaluate symptoms and progress in treatment and Medication management  Observation Level/Precautions:  15 minute checks  Laboratory:   As indicated.  Psychotherapy:    Medications: Restart home medications.  Will obtain a lithium level when appropriate.  Consultations: None  Discharge Concerns:     Estimated LOS: Possibly by next Wednesday.  Other:    Current medications: Patient is being restarted back on the following medications: Vraylar changed to 4.5 mg a day on 06/16/2022 Gabapentin 400 mg at night Lithobid 450 mg twice a day Propranolol 20 mg twice a day  lamotrigine to 50 mg a day  Wellbutrin 75 mg a day started  this morning.   Physician Treatment Plan for Primary Diagnosis: MDD (major depressive disorder) Long Term Goal(s): Improvement in symptoms so as ready for discharge  Short Term Goals: Ability to verbalize feelings will improve, Ability to disclose and discuss suicidal ideas, and Ability to identify and develop effective coping behaviors will improve  Physician Treatment Plan for Secondary Diagnosis: Principal Problem:   MDD (major depressive disorder)  Long Term Goal(s): Improvement in symptoms so as ready for discharge  Short Term Goals: Ability to maintain clinical measurements within normal limits will improve and Compliance with prescribed medications will improve  I certify that inpatient services furnished can reasonably be expected to improve the patient's condition.    Rex Kras, MD 5/11/20249:57 AM Total Time Spent in Direct Patient Care:  I personally spent 30 minutes on the unit in direct patient care. The direct patient care time included face-to-face time with the patient, reviewing the patient's chart, communicating with other professionals, and coordinating care. Greater than 50% of this time was spent in counseling or coordinating care with the patient regarding goals of hospitalization, psycho-education, and discharge planning needs.   Arcadio Cope [id number],[email or web address] Psychiatrist Patient ID: Rollene Fare, female   DOB: [birth year], 27 y.o.   MRN: [id number] Patient ID: NEEVE FREIHEIT, female   DOB: December 24, 1995, 27 y.o.   MRN: [id number]

## 2022-06-17 NOTE — Progress Notes (Signed)
   06/17/22 2120  Psych Admission Type (Psych Patients Only)  Admission Status Voluntary  Psychosocial Assessment  Patient Complaints Anxiety  Eye Contact Fair  Facial Expression Flat  Affect Anxious  Speech Logical/coherent  Interaction Assertive  Motor Activity Slow  Appearance/Hygiene Unremarkable  Behavior Characteristics Cooperative;Calm  Mood Anxious;Pleasant  Thought Process  Coherency WDL  Content Blaming self  Delusions None reported or observed  Perception WDL  Hallucination None reported or observed  Judgment Impaired  Confusion None  Danger to Self  Current suicidal ideation? Denies  Danger to Others  Danger to Others None reported or observed   Pt reports 4/10 anxiety and 5/10 depression. Pt was offered support and encouragement. Pt was given scheduled medications. Q 15 minute checks were done for safety. Pt attended group and interacts well with peers and staff. Pt has no complaints.Pt receptive to treatment and safety maintained on unit.

## 2022-06-17 NOTE — Group Note (Signed)
LCSW Group Therapy Note   Group date:  06/17/2022  Start Time:  10:00am End Time:  11:00am  Type of Therapy and Topic:  Group Therapy: Anger and its Underlying Emotions  Participation Level:  Active   Description of Group:   In this group, patients shared how they typically react when they are angry and heard from each other how much they have in common.  They learned that anger is the "tip of the iceberg" and that the underlying emotions are often much larger.  They identified some of the emotions that frequently are occurring in themselves which lead to anger.  They analyzed how it would be possible to work on resolution of those underlying emotions in order to reduce the anger.  The group discussed a variety of coping skills that are often used as well as potential healthier coping skills that could help with such situations in the future.  Focus was placed on how helpful it is to recognize the underlying emotions to our anger, because working on those can lead to a more permanent solution as well as our ability to focus on the important rather than the urgent.  Therapeutic Goals: Patients will understand that anger is a secondary emotion caused by an underlying feeling. Patients will identify how they typically respond to anger behaviorally, how it has worked for them, as well as how it has worked against them. Patients will explore possible methods of starting to address their underlying emotions such as fear, overstimulation, and being judged.  These methods included communication, boundaries, and more. Patients will learn that anger itself is normal and cannot be eliminated, and that working on the underlying feelings can assist with reducing future anger.  Summary of Patient Progress:  Patient was attentive and shared during the group. Patient shared that her anger is often handled by cutting people off, lashing out, and having violent fantases.  Shee demonstrated growing insight into the  subject matter, was respectful of peers, and fully participated throughout the entire session.  Therapeutic Modalities:   Processing

## 2022-06-17 NOTE — Progress Notes (Signed)
Patient denies Si/HI/AVH and reports anxiety 5/10, and hopelessness 2/10. Patient attends group and is active in the milieu. Patient's mood is improved and she is smiling and interacting more. Patient still makes self-deprecating comments. Patient remains safe on the unit with q 15 min checks.   06/17/22 0900  Psych Admission Type (Psych Patients Only)  Admission Status Voluntary  Psychosocial Assessment  Patient Complaints Anxiety  Eye Contact Fair  Facial Expression Anxious;Blank  Affect Depressed;Anxious  Speech Logical/coherent  Interaction Assertive  Motor Activity Slow  Appearance/Hygiene Unremarkable  Behavior Characteristics Cooperative;Appropriate to situation  Mood Pleasant  Thought Process  Coherency WDL  Content Blaming self  Delusions None reported or observed  Perception WDL  Hallucination None reported or observed  Judgment Limited  Confusion None  Danger to Self  Current suicidal ideation? Denies  Danger to Others  Danger to Others None reported or observed

## 2022-06-17 NOTE — Group Note (Signed)
Date:  06/17/2022 Time:  1:30 PM  Group Topic/Focus:  Goals Group:   The focus of this group is to help patients establish daily goals to achieve during treatment and discuss how the patient can incorporate goal setting into their daily lives to aide in recovery. Orientation:   The focus of this group is to educate the patient on the purpose and policies of crisis stabilization and provide a format to answer questions about their admission.  The group details unit policies and expectations of patients while admitted.    Participation Level:  Active  Participation Quality:  Appropriate  Affect:  Appropriate  Cognitive:  Appropriate  Insight: Appropriate  Engagement in Group:  Engaged  Modes of Intervention:  Activity and Orientation  Additional Comments:   Pt attended and participated in the Orientation/Goals group.   [name] [name] [name] [name] 06/17/2022, 1:30 PM

## 2022-06-18 MED ORDER — BUPROPION HCL ER (XL) 150 MG PO TB24
150.0000 mg | ORAL_TABLET | Freq: Every day | ORAL | Status: DC
  Administered 2022-06-18 – 2022-06-21 (×4): 150 mg via ORAL
  Filled 2022-06-18 (×8): qty 1

## 2022-06-18 NOTE — Progress Notes (Signed)
D:  Patient's self inventory sheet, patient sleeps good, no sleep medication.  Good appetite, normal energy level, good concentration.  Denied depression, rated hopeless and anxiety 2.  Denied withdrawals.  Denied SI.  Denied physical problems.  Denied physical pain.  Goal is work on discharge stabilizing.  Plans to attend groups.  No discharge plans yet. A:  Medications administered per MD orders.  Emotional support and encouragement given patient. R:   Denied SI and HI, contracts for safety.  Denied A/V hallucinations.  Safety maintained with 15 minute checks.

## 2022-06-18 NOTE — [facility] Group Notes (Signed)
[facility] Group Notes:  (Nursing/MHT/Case Management/Adjunct)  Date:  06/18/2022  Time:  4:20 AM  Type of Therapy:   Wrap-up group  Participation Level:  Active  Participation Quality:  Appropriate  Affect:  Appropriate  Cognitive:  Appropriate  Insight:  Appropriate  Engagement in Group:  Engaged  Modes of Intervention:  Education  Summary of Progress/Problems: Goal work on D/C, day 8/10.  Emma Holland 06/18/2022, 4:20 AM

## 2022-06-18 NOTE — Progress Notes (Signed)
Psychiatric progress note.  Patient Identification: Emma Holland MRN:  [id number] Date of Evaluation:  06/18/2022 Chief Complaint:  MDD (major depressive disorder) [F32.9] Principal Diagnosis: MDD (major depressive disorder) Diagnosis:  Principal Problem:   MDD (major depressive disorder)   Reason for admission   The patient is a 27 year old Caucasian female was admitted on a voluntary status for symptoms of depression and suicidal ideations in the context of medication noncompliance. Chart review from last 24 hours   Nursing staff reports medication compliance.  No side effects are noted.   No PRNs were given. No behavioral issues noted.  Continues to contract for safety She slept well last night.  Reports 7 hours of sleep. Yesterday, the psychiatry team made the following recommendations:  Continue home medications. Titrate lamotrigine to 50 mg a day. Continue Wellbutrin 75 mg a day Information obtained during interview   The patient was seen and evaluated and the chart was reviewed and the case was discussed with treatment team.  Patient continues to endorse improving sleep.  She slept 7 hours.  She denies any side effects of medications.  Her depression is now low at a 2/10 but anxiety is a high active 5/10 she denies any suicidal ideations.  Today she denied any mood swings.  She is on Lithobid 450 mg twice a day and is open to increasing it with a level is low.  She is taking Wellbutrin 75 mg a day for the last 3 days with no side effects and agrees to the titration up today.  She is contracting for safety.  She denies any active SI/HI/AVH.  No delusions or paranoia noted.  She appears cognitively intact. The plan is to increase Wellbutrin to Wellbutrin XL 150 mg a day. Associated Signs/Symptoms: Depression Symptoms:  depressed mood, psychomotor retardation, difficulty concentrating, hopelessness, suicidal thoughts with specific plan, anxiety, (Hypo) Manic Symptoms:   Distractibility, Impulsivity, Irritable Mood, Anxiety Symptoms:  Social Anxiety, Psychotic Symptoms:   No psychosis noted. PTSD Symptoms: Negative Total Time spent with patient: 30 minutes  Past Psychiatric History: Patient is being seen at the [facility] for medications for bipolar disorder.  She reports that she has also been diagnosed as ADHD and anxiety.  Is the patient at risk to self? Yes.    Has the patient been a risk to self in the past 6 months? No.  Has the patient been a risk to self within the distant past? No.  Is the patient a risk to others? No.  Has the patient been a risk to others in the past 6 months? No.  Has the patient been a risk to others within the distant past? No.   [place] Scale:  Flowsheet Row [facility] (Current[facility] Most recent reading at 06/14/2022  7:00 PM ED from 06/14/2022 in [facility] at [facility] Most recent reading at 06/14/2022 12:34 AM ED from 05/22/2022 in [facility] Most recent reading at 05/22/2022 10:14 PM  C-SSRS RISK CATEGORY High Risk Moderate Risk No Risk        Prior Inpatient Therapy: No. If yes, describe patient reports that she has never been hospitalized in the past. Prior Outpatient Therapy: Yes.   If yes, describe patient reports that she is seeing a therapist and a Veterinary surgeon.  Alcohol Screening:   Substance Abuse History in the last 12 months:  No. Consequences of Substance Abuse: Negative Previous Psychotropic Medications: Yes  Psychological Evaluations: No  Past Medical History: History reviewed. No pertinent past medical history. History reviewed. No pertinent surgical history. Family History: History reviewed. No pertinent family history. Family Psychiatric  History: Unknown at this time. Tobacco Screening:  Social History   Tobacco Use  Smoking Status Unknown  Smokeless  Tobacco Not on file    BH Tobacco Counseling     Are you interested in Tobacco Cessation Medications?  No value filed. Counseled patient on smoking cessation:  No value filed. Reason Tobacco Screening Not Completed: No value filed.       Social History:  Social History   Substance and Sexual Activity  Alcohol Use Not Currently   Comment: prior alcohol binge use, sober since July 2023     Social History   Substance and Sexual Activity  Drug Use Never    Additional Social History: Marital status: Long term relationship Long term relationship, how long?: 5 yrs What types of issues is patient dealing with in the relationship?: Patient and boyfriend are struggling to manage their "open" relationship.  Patient admits she has ot been honest about recent connections, but afraid to tell bf. Are you sexually active?: Yes What is your sexual orientation?: Bisexual Does patient have children?: No                         Allergies:  No Known Allergies Lab Results:  No results found for this or any previous visit (from the past 48 hour(s)).   Blood Alcohol level:  Lab Results  Component Value Date   ETH <10 06/14/2022   ETH <10 06/23/2021    Metabolic Disorder Labs:  Lab Results  Component Value Date   HGBA1C 4.8 06/23/2021   MPG 91.06 06/23/2021   No results found for: "PROLACTIN" Lab Results  Component Value Date   CHOL 205 (H) 06/23/2021   TRIG 67 06/23/2021   HDL 72 06/23/2021   CHOLHDL 2.8 06/23/2021   VLDL 13 06/23/2021   LDLCALC 120 (H) 06/23/2021    Current Medications: Current Facility-Administered Medications  Medication Dose Route Frequency Provider Last Rate Last Admin   acetaminophen (TYLENOL) tablet 650 mg  650 mg Oral Q6H PRN Oneta Rack, NP       alum & mag hydroxide-simeth (MAALOX/MYLANTA) 200-200-20 MG/5ML suspension 30 mL  30 mL Oral Q4H PRN Oneta Rack, NP       buPROPion (WELLBUTRIN XL) 24 hr tablet 150 mg  150 mg Oral Daily  Rex Kras, MD   150 mg at 06/18/22 8119   cariprazine (VRAYLAR) capsule 4.5 mg  4.5 mg Oral Daily Johnella Crumm, Devainder, MD   4.5 mg at 06/17/22 2129   gabapentin (NEURONTIN) capsule 400 mg  400 mg Oral QHS Rex Kras, MD   400 mg at 06/17/22 2129   lamoTRIgine (LAMICTAL) tablet 50 mg  50 mg Oral Daily Rex Kras, MD   50 mg at 06/18/22 0806   lithium carbonate (ESKALITH) ER tablet 450 mg  450 mg Oral Q12H Rondey Fallen, Rulon Eisenmenger, MD   450 mg at 06/18/22 0831   ziprasidone (GEODON) injection 20 mg  20 mg Intramuscular Q12H PRN Oneta Rack, NP       And   LORazepam (ATIVAN) tablet 1 mg  1 mg Oral PRN Oneta Rack, NP       magnesium hydroxide (MILK OF MAGNESIA) suspension 30 mL  30 mL Oral Daily PRN Oneta Rack, NP  propranolol (INDERAL) tablet 20 mg  20 mg Oral BID Rex Kras, MD   20 mg at 06/18/22 0807   traZODone (DESYREL) tablet 50 mg  50 mg Oral QHS PRN Oneta Rack, NP   50 mg at 06/15/22 2100   PTA Medications: Medications Prior to Admission  Medication Sig Dispense Refill Last Dose   etonogestrel-ethinyl estradiol (NUVARING) 0.12-0.015 MG/24HR vaginal ring Place 1 each vaginally every 28 (twenty-eight) days.      gabapentin (NEURONTIN) 300 MG capsule Take 300-600 mg by mouth at bedtime as needed.      lamoTRIgine (LAMICTAL) 25 MG tablet Take 25 mg by mouth daily.      lithium 300 MG tablet Take 900 mg by mouth at bedtime.      propranolol (INDERAL) 20 MG tablet Take 20 mg by mouth 2 (two) times daily.      VRAYLAR 4.5 MG CAPS Take 1 capsule by mouth at bedtime.       Musculoskeletal: Strength & Muscle Tone: within normal limits Gait & Station: normal Patient leans: N/A            Psychiatric Specialty Exam:  Presentation  General Appearance:  Appropriate for Environment; Casual  Eye Contact: Fair  Speech: Clear and Coherent  Speech Volume: Decreased  Handedness: Right   Mood and Affect  Mood: Anxious;  Depressed  Affect: Restricted   Thought Process  Thought Processes: Coherent  Duration of Psychotic Symptoms:N/A Past Diagnosis of Schizophrenia or Psychoactive disorder: No  Descriptions of Associations:Intact  Orientation:Full (Time, Place and Person)  Thought Content:Perseveration; Rumination  Hallucinations:Hallucinations: None  Ideas of Reference:None  Suicidal Thoughts:Suicidal Thoughts: No  Homicidal Thoughts:Homicidal Thoughts: No   Sensorium  Memory: Immediate Fair; Remote Fair; Recent Fair  Judgment: Fair  Insight: Fair   Art therapist  Concentration: Fair  Attention Span: Fair  Recall: [employer] of Knowledge: Fair  Language: Fair   Psychomotor Activity  Psychomotor Activity: Psychomotor Activity: Normal   Assets  Assets: Desire for Improvement; Communication Skills; Housing   Sleep  Sleep: Sleep: Fair    Physical Exam: Physical Exam Vitals and nursing note reviewed.  Constitutional:      Appearance: Normal appearance.  Neurological:     General: No focal deficit present.     Mental Status: She is alert and oriented to person, place, and time.  Psychiatric:        Mood and Affect: Mood normal.        Behavior: Behavior normal.    Review of Systems  Psychiatric/Behavioral:  Positive for depression and suicidal ideas. The patient is nervous/anxious.   All other systems reviewed and are negative.  Blood pressure 101/72, pulse 86, temperature 98.2 F (36.8 C), temperature source Oral, resp. rate 20, height 5\' 3"  (1.6 m), weight 64.4 kg, last menstrual period 05/30/2022, SpO2 100 %. Body mass index is 25.15 kg/m.  Treatment Plan Summary: Daily contact with patient to assess and evaluate symptoms and progress in treatment and Medication management  Observation Level/Precautions:  15 minute checks  Laboratory:   As indicated.  Psychotherapy:    Medications: Restart home medications.  Will obtain a lithium  level when appropriate.  Consultations: None  Discharge Concerns:    Estimated LOS: Possible discharge by 06/21/2022  Other:    Current medications: Patient is being restarted back on the following medications: Vraylar changed to 4.5 mg a day on 06/16/2022 Gabapentin 400 mg at night Lithobid 450 mg twice a day Propranolol 20 mg twice  a day  lamotrigine to 50 mg a day Increase Wellbutrin to XL 150 mg daily Serum lithium level in the morning   Physician Treatment Plan for Primary Diagnosis: MDD (major depressive disorder) Long Term Goal(s): Improvement in symptoms so as ready for discharge  Short Term Goals: Ability to verbalize feelings will improve, Ability to disclose and discuss suicidal ideas, and Ability to identify and develop effective coping behaviors will improve  Physician Treatment Plan for Secondary Diagnosis: Principal Problem:   MDD (major depressive disorder)  Long Term Goal(s): Improvement in symptoms so as ready for discharge  Short Term Goals: Ability to maintain clinical measurements within normal limits will improve and Compliance with prescribed medications will improve  I certify that inpatient services furnished can reasonably be expected to improve the patient's condition.    Rex Kras, MD 5/12/202410:35 AM Total Time Spent in Direct Patient Care:  I personally spent 30 minutes on the unit in direct patient care. The direct patient care time included face-to-face time with the patient, reviewing the patient's chart, communicating with other professionals, and coordinating care. Greater than 50% of this time was spent in counseling or coordinating care with the patient regarding goals of hospitalization, psycho-education, and discharge planning needs.   Dayshon Roback ZOXW,RU,EAV,WUJWJX Psychiatrist Patient ID: Rollene Fare, female   DOB: [birth year], 27 y.o.   MRN: [id number] Patient ID: ASHAYA BAGBY, female   DOB: 08/08/1995, 27 y.o.   MRN:  [id number] Patient ID: ROSEA SCHEURER, female   DOB: 1995-07-12, 27 y.o.   MRN: [id number]

## 2022-06-18 NOTE — Progress Notes (Signed)
MD ORDERS;  LITHIUM LEVEL TO BE DRAWN EARLY MONDAY MORNING WHEN LAB COMES TO [facility].

## 2022-06-18 NOTE — [facility] Group Notes (Signed)
Adult Psychoeducational Group Note  Date:  06/18/2022 Time:  9:44 PM  Group Topic/Focus:  Wrap-Up Group:   The focus of this group is to help patients review their daily goal of treatment and discuss progress on daily workbooks.  Participation Level:  Active  Participation Quality:  Appropriate  Affect:  Appropriate  Cognitive:  Appropriate  Insight: Appropriate  Engagement in Group:  Engaged  Modes of Intervention:  Discussion  Additional Comments:   Pt states that she had an "up and down" day. Pt spoke with her grandmother to wish her a happy mothers day and went outside. Pt states her anxiety from the day is coming from anticipating her length of stay here. Pt offered encouragement. Pt rates her depression at 5/10  Vevelyn Pat 06/18/2022, 9:44 PM

## 2022-06-18 NOTE — Progress Notes (Signed)
   06/18/22 2140  Psych Admission Type (Psych Patients Only)  Admission Status Voluntary  Psychosocial Assessment  Patient Complaints Anxiety;Worrying  Eye Contact Fair  Facial Expression Flat  Affect Anxious  Speech Logical/coherent  Interaction Assertive  Motor Activity Slow  Appearance/Hygiene Unremarkable  Behavior Characteristics Cooperative;Calm  Mood Anxious;Pleasant  Thought Process  Coherency WDL  Content WDL  Delusions None reported or observed  Perception WDL  Hallucination None reported or observed  Judgment Impaired  Confusion None  Danger to Self  Current suicidal ideation? Denies  Danger to Others  Danger to Others None reported or observed   Pt reports 2/10 anxiety and 5/10 depression. Pt is pleasant and cooperative. Pt was offered support and encouragement. Pt was given scheduled medications and PRN Trazodone per MAR. Q 15 minute checks were done for safety. Pt attends groups and interacts well with peers and staff. Pt has no complaints. Pt receptive to treatment and safety maintained on unit.

## 2022-06-18 NOTE — Plan of Care (Signed)
Nurse discussed coping skills, anxiety and depression with  patient.  

## 2022-06-19 LAB — LITHIUM LEVEL: Lithium Lvl: 0.57 mmol/L — ABNORMAL LOW (ref 0.60–1.20)

## 2022-06-19 MED ORDER — LITHIUM CARBONATE ER 300 MG PO TBCR
600.0000 mg | EXTENDED_RELEASE_TABLET | Freq: Two times a day (BID) | ORAL | Status: DC
  Administered 2022-06-19 – 2022-06-21 (×4): 600 mg via ORAL
  Filled 2022-06-19 (×9): qty 2

## 2022-06-19 MED ORDER — PROPRANOLOL HCL 10 MG PO TABS
10.0000 mg | ORAL_TABLET | Freq: Two times a day (BID) | ORAL | Status: DC
  Administered 2022-06-19 – 2022-06-21 (×3): 10 mg via ORAL
  Filled 2022-06-19 (×9): qty 1

## 2022-06-19 NOTE — [facility] Group Notes (Signed)
The focus of this group is to help patients establish daily goals to achieve during treatment and discuss how the patient can incorporate goal setting into their daily lives to aide in recovery.   Scale 1-10 5 out of 10    Goal: speak with sister, work on discharge plan to leave

## 2022-06-19 NOTE — Progress Notes (Signed)
Raj Janus, Chaplain Spiritual care group on Spiritual Resources facilitated by [employer], [facility] and Chaplain Gayla Medicus  Group Goal: Support / Education around [employer] - Strength  Members engage in facilitated group support and psycho-social education.  Group Description:  Following introductions and group rules, group members engaged in facilitated group dialogue and support around the spiritual resource strength.Group members engaged in an activity of identifying their strengths and sharing experiences of how they have used them. Participants also shared what helps them when they don't feel strong and who/what helps them in those challenging circumstances. Facilitators normalize experiences and assisted in helping participants identify ways to tap into their strengths. Group encouraged individual reflection on gaining strength and using their resources to gain other perspectives on what having strength means.   Group drew on Adlerian / Rogerian and narrative framework  Patient Progress: Emma Holland attended group and actively engaged and participated in the group conversation and activities. Her comments demonstrated good insight into the topic and she supported her peers in words and behavior.   Participation Level:  Active Participation Quality:  Appropriate Insight: Appropriate Engagement in Group:  Engaged Modes of Intervention:  Discussion and Worksheets  Chaplain [employer]

## 2022-06-19 NOTE — Progress Notes (Signed)
Patient denies SI/ HI/ AVH. Patient does endorse mood lability, but reports she feels bettersince being here and attributes it to vraylar.  She rates anxiety and depression 0/10. Emma Holland Patient's lithium level came back at 0.57. Patient is interactive and pleasant in the milieu. Patient remains safe on the unit with Q 15 min safety checks onging.   06/19/22 1000  Psych Admission Type (Psych Patients Only)  Admission Status Voluntary  Psychosocial Assessment  Patient Complaints Anxiety;None;Other (Comment) (lability)  Eye Contact Fair  Facial Expression Flat  Affect Flat  Speech Logical/coherent  Interaction Assertive  Motor Activity Slow  Appearance/Hygiene Unremarkable  Behavior Characteristics Cooperative;Calm  Mood Pleasant  Thought Process  Coherency WDL  Content WDL  Delusions None reported or observed  Perception WDL  Hallucination None reported or observed  Judgment Impaired  Confusion None  Danger to Self  Current suicidal ideation? Denies  Danger to Others  Danger to Others None reported or observed

## 2022-06-19 NOTE — Group Note (Signed)
Occupational Therapy Group Note  Group Topic:Coping Skills  Group Date: 06/19/2022 Start Time: 1430 End Time: 1505 Facilitators: Zealand Boyett G, OT   Group Description: Group encouraged increased engagement and participation through discussion and activity focused on "Coping Ahead." Patients were split up into teams and selected a card from a stack of positive coping strategies. Patients were instructed to act out/charade the coping skill for other peers to guess and receive points for their team. Discussion followed with a focus on identifying additional positive coping strategies and patients shared how they were going to cope ahead over the weekend while continuing hospitalization stay.  Therapeutic Goal(s): Identify positive vs negative coping strategies. Identify coping skills to be used during hospitalization vs coping skills outside of hospital/at home Increase participation in therapeutic group environment and promote engagement in treatment   Participation Level: Engaged   Participation Quality: Independent   Behavior: Appropriate   Speech/Thought Process: Relevant   Affect/Mood: Appropriate   Insight: Improved   Judgement: Improved      Modes of Intervention: Education  Patient Response to Interventions:  Attentive   Plan: Continue to engage patient in OT groups 2 - 3x/week.  06/19/2022  Cylan Borum G Ritu Gagliardo, OT  Mylisa Brunson, OT  

## 2022-06-19 NOTE — Progress Notes (Addendum)
Psychiatric progress note.  Patient Identification: Emma Holland MRN:  [id number] Date of Evaluation:  06/19/2022 Chief Complaint:  MDD (major depressive disorder) [F32.9] Principal Diagnosis: MDD (major depressive disorder) Diagnosis:  Principal Problem:   MDD (major depressive disorder)   Reason for admission   The patient is a 27 year old Caucasian female was admitted on a voluntary status for symptoms of depression and suicidal ideations in the context of medication noncompliance. Chart review from last 24 hours   Nursing staff reports medication compliance. No complaints  No side effects are noted.   Trazodone was given as a PRN. No behavioral issues noted.  Continues to contract for safety She slept well last night.  Reports 6 hours of sleep. Yesterday, the psychiatry team made the following recommendations:  Continue home medications. Titrate lamotrigine to 50 mg a day. Increase Wellbutrin XL to 150 mg a day  Lithium level in AM Information obtained during interview   The patient was seen and evaluated.Patient continues to endorse improving mood swings.  She reports no side effects on the increased dose of Wellbutrin XL 150 mg a day.  She is taking the lithium as prescribed with no side effects.  She states that she slept over 6 hours to last night.  She is looking forward to discharge and going home to her fianc.  However she is fearful that " her medications will wear off in the future" and that she will get depressed again. Lithium level was 0.57 at 450 mg p.o. twice daily.  Patient is currently not manic.  Her depression is improved.  The level could be slightly higher but she has a  psychiatrist and a therapist with whom she has a good relationship and and she reports that she can go back to see them.  Based on this information she could be discharged tomorrow with a safety plan.  Lithium level can be repeated in about a month to assess safety needs to be  increased.  Addendum: Talked to patient and increased Lithobid to 600 mg BID today. She agrees to stay till Wednesday . Associated Signs/Symptoms: Depression Symptoms:  depressed mood, psychomotor retardation, difficulty concentrating, hopelessness, suicidal thoughts with specific plan, anxiety, (Hypo) Manic Symptoms:  Distractibility, Impulsivity, Irritable Mood, Anxiety Symptoms:  Social Anxiety, Psychotic Symptoms:   No psychosis noted. PTSD Symptoms: Negative Total Time spent with patient: 30 minutes  Past Psychiatric History: Patient is being seen at [facility] for medications for bipolar disorder.  She reports that she has also been diagnosed as ADHD and anxiety.  Is the patient at risk to self? Yes.    Has the patient been a risk to self in the past 6 months? No.  Has the patient been a risk to self within the distant past? No.  Is the patient a risk to others? No.  Has the patient been a risk to others in the past 6 months? No.  Has the patient been a risk to others within the distant past? No.   [place] Scale:  Flowsheet Row [facility] ([facility] Most recent reading at 06/14/2022  7:00 PM ED from 06/14/2022 in [facility] Most recent reading at 06/14/2022 12:34 AM ED from 05/22/2022 in [facility] Most recent reading at 05/22/2022 10:14 PM  C-SSRS RISK CATEGORY High Risk Moderate Risk No Risk        Prior Inpatient Therapy: No. If yes,  describe patient reports that she has never been hospitalized in the past. Prior Outpatient Therapy: Yes.   If yes, describe patient reports that she is seeing a therapist and a Veterinary surgeon.  Alcohol Screening:   Substance Abuse History in the last 12 months:  No. Consequences of Substance Abuse: Negative Previous Psychotropic Medications: Yes  Psychological Evaluations: No   Past Medical History: History reviewed. No pertinent past medical history. History reviewed. No pertinent surgical history. Family History: History reviewed. No pertinent family history. Family Psychiatric  History: Unknown at this time. Tobacco Screening:  Social History   Tobacco Use  Smoking Status Unknown  Smokeless Tobacco Not on file    BH Tobacco Counseling     Are you interested in Tobacco Cessation Medications?  No value filed. Counseled patient on smoking cessation:  No value filed. Reason Tobacco Screening Not Completed: No value filed.       Social History:  Social History   Substance and Sexual Activity  Alcohol Use Not Currently   Comment: prior alcohol binge use, sober since July 2023     Social History   Substance and Sexual Activity  Drug Use Never    Additional Social History: Marital status: Long term relationship Long term relationship, how long?: 5 yrs What types of issues is patient dealing with in the relationship?: Patient and boyfriend are struggling to manage their "open" relationship.  Patient admits she has ot been honest about recent connections, but afraid to tell bf. Are you sexually active?: Yes What is your sexual orientation?: Bisexual Does patient have children?: No                         Allergies:  No Known Allergies Lab Results:  Results for orders placed or performed during the hospital encounter of 06/14/22 (from the past 48 hour(s))  Lithium level     Status: Abnormal   Collection Time: 06/19/22  6:16 AM  Result Value Ref Range   Lithium Lvl 0.57 (L) 0.60 - 1.20 mmol/L    Comment: Performed at [facility], [address], [place], [place] [address]     Blood Alcohol level:  Lab Results  Component Value Date   ETH <10 06/14/2022   ETH <10 06/23/2021    Metabolic Disorder Labs:  Lab Results  Component Value Date   HGBA1C 4.8 06/23/2021   MPG 91.06 06/23/2021   No results found for:  "PROLACTIN" Lab Results  Component Value Date   CHOL 205 (H) 06/23/2021   TRIG 67 06/23/2021   HDL 72 06/23/2021   CHOLHDL 2.8 06/23/2021   VLDL 13 06/23/2021   LDLCALC 120 (H) 06/23/2021    Current Medications: Current Facility-Administered Medications  Medication Dose Route Frequency Provider Last Rate Last Admin   acetaminophen (TYLENOL) tablet 650 mg  650 mg Oral Q6H PRN Oneta Rack, NP       alum & mag hydroxide-simeth (MAALOX/MYLANTA) 200-200-20 MG/5ML suspension 30 mL  30 mL Oral Q4H PRN Oneta Rack, NP       buPROPion (WELLBUTRIN XL) 24 hr tablet 150 mg  150 mg Oral Daily Rex Kras, MD   150 mg at 06/19/22 0735   cariprazine (VRAYLAR) capsule 4.5 mg  4.5 mg Oral Daily Marlys Stegmaier, Devainder, MD   4.5 mg at 06/18/22 2101   gabapentin (NEURONTIN) capsule 400 mg  400 mg Oral QHS Rex Kras, MD   400 mg at 06/18/22 2102   lamoTRIgine (LAMICTAL)  tablet 50 mg  50 mg Oral Daily Rex Kras, MD   50 mg at 06/19/22 0735   lithium carbonate (ESKALITH) ER tablet 450 mg  450 mg Oral Q12H Rex Kras, MD   450 mg at 06/19/22 0738   ziprasidone (GEODON) injection 20 mg  20 mg Intramuscular Q12H PRN Oneta Rack, NP       And   LORazepam (ATIVAN) tablet 1 mg  1 mg Oral PRN Oneta Rack, NP       magnesium hydroxide (MILK OF MAGNESIA) suspension 30 mL  30 mL Oral Daily PRN Oneta Rack, NP       propranolol (INDERAL) tablet 20 mg  20 mg Oral BID Rex Kras, MD   20 mg at 06/19/22 0735   traZODone (DESYREL) tablet 50 mg  50 mg Oral QHS PRN Oneta Rack, NP   50 mg at 06/18/22 2309   PTA Medications: Medications Prior to Admission  Medication Sig Dispense Refill Last Dose   etonogestrel-ethinyl estradiol (NUVARING) 0.12-0.015 MG/24HR vaginal ring Place 1 each vaginally every 28 (twenty-eight) days.      gabapentin (NEURONTIN) 300 MG capsule Take 300-600 mg by mouth at bedtime as needed.      lamoTRIgine (LAMICTAL) 25 MG tablet Take 25 mg by mouth  daily.      lithium 300 MG tablet Take 900 mg by mouth at bedtime.      propranolol (INDERAL) 20 MG tablet Take 20 mg by mouth 2 (two) times daily.      VRAYLAR 4.5 MG CAPS Take 1 capsule by mouth at bedtime.       Musculoskeletal: Strength & Muscle Tone: within normal limits Gait & Station: normal Patient leans: N/A            Psychiatric Specialty Exam:  Presentation  General Appearance:  Appropriate for Environment; Casual  Eye Contact: Fair  Speech: Clear and Coherent  Speech Volume: Decreased  Handedness: Right   Mood and Affect  Mood: Anxious; Depressed  Affect: Restricted   Thought Process  Thought Processes: Coherent  Duration of Psychotic Symptoms:N/A Past Diagnosis of Schizophrenia or Psychoactive disorder: No  Descriptions of Associations:Intact  Orientation:Full (Time, Place and Person)  Thought Content:Perseveration; Rumination  Hallucinations:Hallucinations: None  Ideas of Reference:None  Suicidal Thoughts:Suicidal Thoughts: No  Homicidal Thoughts:Homicidal Thoughts: No   Sensorium  Memory: Immediate Fair; Remote Fair; Recent Fair  Judgment: Fair  Insight: Fair   Art therapist  Concentration: Fair  Attention Span: Fair  Recall: [employer] of Knowledge: Fair  Language: Fair   Psychomotor Activity  Psychomotor Activity: Psychomotor Activity: Normal   Assets  Assets: Desire for Improvement; Communication Skills; Housing   Sleep  Sleep: Sleep: Fair    Physical Exam: Physical Exam Vitals and nursing note reviewed.  Constitutional:      Appearance: Normal appearance.  Neurological:     General: No focal deficit present.     Mental Status: She is alert and oriented to person, place, and time.  Psychiatric:        Mood and Affect: Mood normal.        Behavior: Behavior normal.    Review of Systems  Psychiatric/Behavioral:  Positive for depression and suicidal ideas. The patient  is nervous/anxious.   All other systems reviewed and are negative.  Blood pressure 94/60, pulse 72, temperature 98.2 F (36.8 C), temperature source Oral, resp. rate 20, height 5\' 3"  (1.6 m), weight 64.4 kg, last menstrual period 05/30/2022, SpO2 100 %.  Body mass index is 25.15 kg/m.  Treatment Plan Summary: Daily contact with patient to assess and evaluate symptoms and progress in treatment and Medication management  Observation Level/Precautions:  15 minute checks  Laboratory:   As indicated.  Psychotherapy:    Medications: Restart home medications.  Will obtain a lithium level when appropriate.  Consultations: None  Discharge Concerns:    Estimated LOS: Possible discharge by 06/21/2022.  Other:    Current medications: Patient is being restarted back on the following medications: Vraylar changed to 4.5 mg a day on 06/16/2022 Gabapentin 400 mg at night Increase Lithobid 600 mg twice a day on 06/19/2022 Propranolol 20 mg twice a day  lamotrigine to 50 mg a day  Wellbutrin to XL 150 mg daily Serum lithium level was done in the morning and was noted to be 0.57.   Physician Treatment Plan for Primary Diagnosis: MDD (major depressive disorder) Long Term Goal(s): Improvement in symptoms so as ready for discharge  Short Term Goals: Ability to verbalize feelings will improve, Ability to disclose and discuss suicidal ideas, and Ability to identify and develop effective coping behaviors will improve  Physician Treatment Plan for Secondary Diagnosis: Principal Problem:   MDD (major depressive disorder)  Long Term Goal(s): Improvement in symptoms so as ready for discharge  Short Term Goals: Ability to maintain clinical measurements within normal limits will improve and Compliance with prescribed medications will improve  I certify that inpatient services furnished can reasonably be expected to improve the patient's condition.    Rex Kras, MD 5/13/20243:43 PM Total Time Spent in  Direct Patient Care:  I personally spent 30 minutes on the unit in direct patient care. The direct patient care time included face-to-face time with the patient, reviewing the patient's chart, communicating with other professionals, and coordinating care. Greater than 50% of this time was spent in counseling or coordinating care with the patient regarding goals of hospitalization, psycho-education, and discharge planning needs.   Isaah Furry EAVW,UJ,WJX,BJYNWG Psychiatrist Patient ID: Rollene Fare, female   DOB: 08-29-1995, 27 y.o.   MRN: [id number] Patient ID: LASHYIA LAGNESE, female   DOB: 14-Jan-1996, 27 y.o.   MRN: [id number] Patient ID: KHALEELAH STEEN, female   DOB: [birth year], 27 y.o.   MRN: [id number] Patient ID: PRABHNOOR PIECHOCKI, female   DOB: 1995/09/03, 27 y.o.   MRN: [id number]

## 2022-06-19 NOTE — Group Note (Signed)
Recreation Therapy Group Note   Group Topic:Health and Wellness  Group Date: 06/19/2022 Start Time: 0930 End Time: 1610 Facilitators: Raniyah Curenton-McCall, LRT,CTRS Location: [address] Dayroom   Goal Area(s) Addresses:  Patient will define components of whole wellness. Patient will verbalize benefit of whole wellness.  Group Description:  LRT and patients conducted chair exercises.  Patients led each other in the exercises of their choosing.  Patients were to fully engage in activity to become relaxed, loose and calmer.   Affect/Mood: Appropriate   Participation Level: Engaged   Participation Quality: Independent   Behavior: Appropriate   Speech/Thought Process: Focused   Insight: Moderate   Judgement: Moderate   Modes of Intervention: Exercise   Patient Response to Interventions:  Engaged   Education Outcome:  Acknowledges education   Clinical Observations/Individualized Feedback: Pt attended and participated in group activity.     Plan: Continue to engage patient in RT group sessions 2-3x/week.   Emma Holland, LRT,CTRS 06/19/2022 11:33 AM

## 2022-06-19 NOTE — Progress Notes (Signed)
Adult Psychoeducational Group Note  Date:  06/19/2022 Time:  11:33 PM  Group Topic/Focus:  Wrap-Up Group:   The focus of this group is to help patients review their daily goal of treatment and discuss progress on daily workbooks.  Participation Level:  Active  Participation Quality:  Appropriate  Affect:  Appropriate  Cognitive:  Appropriate  Insight: Appropriate  Engagement in Group:  Engaged  Modes of Intervention:  Education  Additional Comments:  Pt did attend AA group and actively participated.  Wynema Birch D 06/19/2022, 11:33 PM

## 2022-06-19 NOTE — [facility] Suicide Risk Assessment (Signed)
[facility] INPATIENT:  Family/Significant Other Suicide Prevention Education  Suicide Prevention Education:  Patient Refusal for Family/Significant Other Suicide Prevention Education: The patient Emma Holland has refused to provide written consent for family/significant other to be provided Family/Significant Other Suicide Prevention Education during admission and/or prior to discharge.  Physician notified.  Miron Marxen S Godric Lavell 06/19/2022, 2:28 PM

## 2022-06-20 LAB — PREGNANCY, URINE: Preg Test, Ur: NEGATIVE

## 2022-06-20 NOTE — Group Note (Signed)
Date:  06/20/2022 Time:  11:10 AM  Group Topic/Focus:  Developing a Wellness Toolbox:   The focus of this group is to help patients develop a "wellness toolbox" with skills and strategies to promote recovery upon discharge.    Participation Level:  Active  Participation Quality:  Appropriate  Affect:  Appropriate  Cognitive:  Alert  Insight: Appropriate  Engagement in Group:  Engaged  Modes of Intervention:  Activity  Additional Comments:  Pt participated in Social Wellness group. Today's lesson was Communication and pt participated in Communication Activity with other group members.   Beckie Busing 06/20/2022, 11:10 AM

## 2022-06-20 NOTE — Progress Notes (Addendum)
EKG done and places in pt's chart

## 2022-06-20 NOTE — Progress Notes (Signed)
   06/20/22 0830  Psych Admission Type (Psych Patients Only)  Admission Status Voluntary  Psychosocial Assessment  Patient Complaints None  Eye Contact Fair  Facial Expression Flat  Affect Blunted  Speech Logical/coherent  Interaction Assertive  Motor Activity Other (Comment) (wdl)  Appearance/Hygiene Unremarkable  Behavior Characteristics Cooperative  Mood Pleasant  Thought Process  Coherency WDL  Content WDL  Delusions None reported or observed  Perception WDL  Hallucination None reported or observed  Judgment Poor  Confusion None  Danger to Self  Current suicidal ideation? Denies  Danger to Others  Danger to Others None reported or observed   EKG completed with Vikki Ports RN and placed in pt chart and urine collected and placed in fridge for ordered urine pregnancy test. Pt denies SI and any complaints or concerns.

## 2022-06-20 NOTE — Plan of Care (Signed)
  Problem: Activity: Goal: Interest or engagement in activities will improve Outcome: Progressing   Problem: Coping: Goal: Ability to verbalize frustrations and anger appropriately will improve Outcome: Progressing Goal: Ability to demonstrate self-control will improve Outcome: Progressing   Problem: Health Behavior/Discharge Planning: Goal: Identification of resources available to assist in meeting health care needs will improve Outcome: Progressing Goal: Compliance with treatment plan for underlying cause of condition will improve Outcome: Progressing   Problem: Physical Regulation: Goal: Ability to maintain clinical measurements within normal limits will improve Outcome: Progressing   Problem: Activity: Goal: Interest or engagement in leisure activities will improve Outcome: Progressing

## 2022-06-20 NOTE — Progress Notes (Signed)
   06/20/22 2000  Psych Admission Type (Psych Patients Only)  Admission Status Voluntary  Psychosocial Assessment  Patient Complaints None  Eye Contact Fair  Facial Expression Flat  Affect Blunted  Speech Logical/coherent  Interaction Assertive  Motor Activity Slow  Appearance/Hygiene Unremarkable  Behavior Characteristics Cooperative;Appropriate to situation  Mood Pleasant  Thought Process  Coherency WDL  Content WDL  Delusions None reported or observed  Perception WDL  Hallucination None reported or observed  Judgment WDL  Confusion None  Danger to Self  Current suicidal ideation? Denies  Danger to Others  Danger to Others None reported or observed

## 2022-06-20 NOTE — Group Note (Signed)
Date:  06/20/2022 Time:  9:55 AM  Group Topic/Focus:  Goals Group:   The focus of this group is to help patients establish daily goals to achieve during treatment and discuss how the patient can incorporate goal setting into their daily lives to aide in recovery.    Participation Level:  Active  Participation Quality:  Appropriate  Affect:  Appropriate  Cognitive:  Alert  Insight: Appropriate  Engagement in Group:  Engaged  Modes of Intervention:  Discussion  Additional Comments:  Pt stated that her goal for today was to make it to all of the groups.   Beckie Busing 06/20/2022, 9:55 AM

## 2022-06-20 NOTE — Progress Notes (Signed)
[facility] MD Progress Note  06/20/2022 9:58 AM SHAMICKA DWORSKY  MRN:  [id number]   Reason for Admission:  Emma Holland is a 27 y.o. female with a history of bipolar disorder, anxiety and ADHD, who was initially admitted for inpatient psychiatric hospitalization on 06/14/2022 for management of worsening depression, suicidal ideation in context of medication noncompliance. The patient is currently on Hospital Day 6.   Chart Review from last 24 hours:  The patient's chart was reviewed and nursing notes were reviewed. The patient's case was discussed in multidisciplinary team meeting. Per [facility] patient did not require any as needed medication for agitation or anxiety, used trazodone as needed twice during this stay on 5/9 and 5/12.  Information Obtained Today During Patient Interview: The patient was seen and evaluated on the unit. On assessment today the patient reports admission was triggered by noncompliance of medications at home for just 1 or 2 days "I missed some doses I did not feel good I did not trust myself not to kill myself" she admits to having suicidal ideation with a plan to get drunk and overdose of Tylenol prior to admission.  Notes since admission and medications were adjusted feels less depressed with less mood instability and less mood swings, she also reports much less crying spells and feeling hopelessness, she denies passive or active SI for the past 2 days, denies HI or AVH.  Reports fair sleep and appetite, improved energy and motivation.  She does report episodes of impulsivity related to mood instability and mania last time in December last year when "I cheated on my boyfriend" she denies any illicit drug use or alcohol prior to admission for at least 10 months prior to "drinking alcohol almost on a regular basis with history of being in rehab years ago   Sleep  Sleep: Fair, improved per patient's report  Principal Problem: MDD (major depressive disorder) Diagnosis: Principal  Problem:   MDD (major depressive disorder)    Past Psychiatric History: History of diagnosed bipolar disorder and ADHD, history of outpatient treatment with psychiatrist for medication management as well as individual counseling.  Past Medical History: History reviewed. No pertinent past medical history. History reviewed. No pertinent surgical history. Family History: History reviewed. No pertinent family history. Family Psychiatric  History: No pertinent family history reported Social History: Lives with boyfriend of 5 years, works at [employer], reports some work stressors, never married, no children, denies pending legal charges or court dates, denies access to weapons or guns at home  Current Medications: Current Facility-Administered Medications  Medication Dose Route Frequency Provider Last Rate Last Admin   acetaminophen (TYLENOL) tablet 650 mg  650 mg Oral Q6H PRN Oneta Rack, NP       alum & mag hydroxide-simeth (MAALOX/MYLANTA) 200-200-20 MG/5ML suspension 30 mL  30 mL Oral Q4H PRN Oneta Rack, NP       buPROPion (WELLBUTRIN XL) 24 hr tablet 150 mg  150 mg Oral Daily Rex Kras, MD   150 mg at 06/20/22 0824   cariprazine (VRAYLAR) capsule 4.5 mg  4.5 mg Oral Daily Goli, Devainder, MD   4.5 mg at 06/19/22 2116   gabapentin (NEURONTIN) capsule 400 mg  400 mg Oral QHS Rex Kras, MD   400 mg at 06/19/22 2116   lamoTRIgine (LAMICTAL) tablet 50 mg  50 mg Oral Daily Rex Kras, MD   50 mg at 06/20/22 0825   lithium carbonate (LITHOBID) ER tablet 600 mg  600 mg Oral Q12H Goli, Veeraindar,  MD   600 mg at 06/20/22 0824   ziprasidone (GEODON) injection 20 mg  20 mg Intramuscular Q12H PRN Oneta Rack, NP       And   LORazepam (ATIVAN) tablet 1 mg  1 mg Oral PRN Oneta Rack, NP       magnesium hydroxide (MILK OF MAGNESIA) suspension 30 mL  30 mL Oral Daily PRN Oneta Rack, NP       propranolol (INDERAL) tablet 10 mg  10 mg Oral BID Rex Kras, MD   10 mg  at 06/20/22 0824   traZODone (DESYREL) tablet 50 mg  50 mg Oral QHS PRN Oneta Rack, NP   50 mg at 06/18/22 2309    Lab Results:  Results for orders placed or performed during the hospital encounter of 06/14/22 (from the past 48 hour(s))  Lithium level     Status: Abnormal   Collection Time: 06/19/22  6:16 AM  Result Value Ref Range   Lithium Lvl 0.57 (L) 0.60 - 1.20 mmol/L    Comment: Performed at [facility], [address], [place], [place] [address]    Blood Alcohol level:  Lab Results  Component Value Date   ETH <10 06/14/2022   ETH <10 06/23/2021    Metabolic Disorder Labs: Lab Results  Component Value Date   HGBA1C 4.8 06/23/2021   MPG 91.06 06/23/2021   No results found for: "PROLACTIN" Lab Results  Component Value Date   CHOL 205 (H) 06/23/2021   TRIG 67 06/23/2021   HDL 72 06/23/2021   CHOLHDL 2.8 06/23/2021   VLDL 13 06/23/2021   LDLCALC 120 (H) 06/23/2021    Physical Findings: AIMS: Facial and Oral Movements Muscles of Facial Expression: None, normal Lips and Perioral Area: None, normal Jaw: None, normal Tongue: None, normal,Extremity Movements Upper (arms, wrists, hands, fingers): None, normal Lower (legs, knees, ankles, toes): None, normal, Trunk Movements Neck, shoulders, hips: None, normal, Overall Severity Severity of abnormal movements (highest score from questions above): None, normal Incapacitation due to abnormal movements: None, normal Patient's awareness of abnormal movements (rate only patient's report): No Awareness, Dental Status Current problems with teeth and/or dentures?: No Does patient usually wear dentures?: No  CIWA:    COWS:     Musculoskeletal: Strength & Muscle Tone: within normal limits Gait & Station: normal Patient leans: N/A  Psychiatric Specialty Exam:  General Appearance: Casually dressed, fairly groomed, appears at stated age, blue colored short hair noted  Behavior: Calm and  cooperative  Psychomotor Activity: No psychomotor agitation but slight retardation noted  Eye Contact: Fair Speech: Normal except for decreased tone and volume, seems to be at baseline   Mood: Mildly dysphoric Affect: Congruent  Thought Process: Linear and goal directed, no circumstantiality or tangentiality noted, no flight of ideas or racing thoughts noted Descriptions of Associations: Intact, concrete Thought Content: Hallucinations: Denies AH, VH  Delusions: No paranoia  Suicidal Thoughts: Denies passive or active SI, intention, plan  Homicidal Thoughts: Denies HI, intention, plan   Alertness/Orientation: Alert and oriented  Insight: Improved, fair Judgment: Improved, fair  Memory: Intact  Executive Functions  Concentration: Intact Attention Span: Fair Recall: [employer] of Knowledge: Fair   Assets  Assets: Desire for Improvement; Manufacturing systems engineer; Housing    Physical Exam: Physical Exam Vitals and nursing note reviewed.    Review of Systems  All other systems reviewed and are negative.  Blood pressure 122/72, pulse 88, temperature 98.3 F (36.8 C), temperature source Oral,  resp. rate 20, height 5\' 3"  (1.6 m), weight 64.4 kg, last menstrual period 05/30/2022, SpO2 100 %. Body mass index is 25.15 kg/m.   Treatment Plan Summary: Daily contact with patient to assess and evaluate symptoms and progress in treatment and Medication management  ASSESSMENT:  Diagnoses / Active Problems: Principal Problem: MDD (major depressive disorder) Diagnosis: Principal Problem:   MDD (major depressive disorder)   PLAN: Safety and Monitoring:  -- Voluntary admission to inpatient psychiatric unit for safety, stabilization and treatment  -- Daily contact with patient to assess and evaluate symptoms and progress in treatment  -- Patient's case to be discussed in multi-disciplinary team meeting  -- Observation Level : q15 minute checks  -- Vital signs:  q12  hours  -- Precautions: suicide, elopement, and assault  2. Medications:   Continue Wellbutrin XL 150 mg daily for depression  Continue Vraylar 4.5 mg daily for mood stabilization  Continue gabapentin 400 mg at bedtime for mood, anxiety and sleep  Continue Lamictal 50 mg daily for mood stabilization  Continue lithium 600 mg twice daily for mood stabilization  Continue Inderal 10 mg twice daily for anxiety  Continue trazodone 50 mg at bedtime as needed for sleep  Continue as needed medications for agitation, pain, indigestion   The risks/benefits/side-effects/alternatives to this medication were discussed in detail with the patient and time was given for questions. The patient consents to medication trial.    -- Metabolic profile and EKG monitoring obtained while on an atypical antipsychotic (BMI: Lipid Panel: HbgA1c: QTc:)    3. Pertinent labs: CMP no significant abnormalities noted except for slightly low potassium 3.4, CBC within normal level lithium level below therapeutic 0.57 on 5/13, hemoglobin A1c 4.8, TSH within normal level UDS negative EKG 5/14 normal sinus rhythm     Lab ordered: Lithium level 5/15 to ensure no toxicity, pregnancy test  5. Group and Therapy: -- Encouraged patient to participate in unit milieu and in scheduled group therapies       -- Short Term Goals: Ability to identify changes in lifestyle to reduce recurrence of condition will improve, Ability to verbalize feelings will improve, Ability to disclose and discuss suicidal ideas, Ability to demonstrate self-control will improve, and Ability to identify and develop effective coping behaviors will improve  -- Long Term Goals: Improvement in symptoms so as ready for discharge  6. Discharge Planning:   -- Social work and case management to assist with discharge planning and identification of hospital follow-up needs prior to discharge  -- Estimated LOS: 5-7 days  -- Discharge Concerns: Need to establish a safety  plan; Medication compliance and effectiveness  -- Discharge Goals: Return home with outpatient referrals for mental health follow-up including medication management/psychotherapy      Total Time Spent in Direct Patient Care:  I personally spent 35 minutes on the unit in direct patient care. The direct patient care time included face-to-face time with the patient, reviewing the patient's chart, communicating with other professionals, and coordinating care. Greater than 50% of this time was spent in counseling or coordinating care with the patient regarding goals of hospitalization, psycho-education, and discharge planning needs.   Ignacio Lowder Abbott Pao, MD 06/20/2022, 9:58 AM

## 2022-06-20 NOTE — Progress Notes (Signed)
Report given to mariam RN  

## 2022-06-20 NOTE — Plan of Care (Signed)

## 2022-06-20 NOTE — Progress Notes (Addendum)
Pt provided handout with information on lithium medication

## 2022-06-20 NOTE — Progress Notes (Signed)
   06/19/22 1950  Psych Admission Type (Psych Patients Only)  Admission Status Voluntary  Psychosocial Assessment  Patient Complaints Anxiety  Eye Contact Fair  Facial Expression Flat  Affect Blunted  Speech Logical/coherent  Interaction Assertive  Motor Activity Slow  Appearance/Hygiene Unremarkable  Behavior Characteristics Cooperative;Appropriate to situation  Mood Pleasant  Thought Process  Coherency WDL  Content WDL  Delusions None reported or observed  Perception WDL  Hallucination None reported or observed  Judgment WDL  Confusion None  Danger to Self  Current suicidal ideation? Denies  Danger to Others  Danger to Others None reported or observed

## 2022-06-20 NOTE — Group Note (Signed)
Recreation Therapy Group Note   Group Topic:Animal Assisted Therapy   Group Date: 06/20/2022 Start Time: 0945 End Time: 1030 Facilitators: Beatrice Sehgal-McCall, LRT,CTRS Location: [address] Dayroom   Animal-Assisted Activity (AAA) Program Checklist/Progress Notes Patient Eligibility Criteria Checklist & Daily Group note for Rec Tx Intervention  AAA/T Program Assumption of Risk Form signed by Patient/ or Parent Legal Guardian Yes  Patient is free of allergies or severe asthma Yes  Patient reports no fear of animals Yes  Patient reports no history of cruelty to animals Yes  Patient understands his/her participation is voluntary Yes  Patient washes hands before animal contact Yes  Patient washes hands after animal contact Yes   Affect/Mood: Appropriate   Participation Level: Engaged   Participation Quality: Independent   Behavior: Appropriate   Speech/Thought Process: Focused   Insight: Good   Judgement: Good   Modes of Intervention: Teaching laboratory technician   Patient Response to Interventions:  Engaged   Education Outcome:  Acknowledges education   Clinical Observations/Individualized Feedback:    Plan: Continue to engage patient in RT group sessions 2-3x/week.   Emma Holland, LRT,CTRS 06/20/2022 1:04 PM

## 2022-06-21 ENCOUNTER — Encounter (HOSPITAL_COMMUNITY): Payer: Self-pay | Admitting: Psychiatry

## 2022-06-21 ENCOUNTER — Encounter (HOSPITAL_COMMUNITY): Payer: Self-pay

## 2022-06-21 DIAGNOSIS — F314 Bipolar disorder, current episode depressed, severe, without psychotic features: Principal | ICD-10-CM | POA: Insufficient documentation

## 2022-06-21 DIAGNOSIS — F3163 Bipolar disorder, current episode mixed, severe, without psychotic features: Secondary | ICD-10-CM

## 2022-06-21 LAB — LITHIUM LEVEL: Lithium Lvl: 0.96 mmol/L (ref 0.60–1.20)

## 2022-06-21 MED ORDER — TRAZODONE HCL 50 MG PO TABS: 50.0000 mg | ORAL_TABLET | Freq: Every evening | ORAL | 0 refills | Status: AC | PRN

## 2022-06-21 MED ORDER — CARIPRAZINE HCL 4.5 MG PO CAPS: 4.5000 mg | ORAL_CAPSULE | Freq: Every day | ORAL | 0 refills | Status: AC

## 2022-06-21 MED ORDER — LAMOTRIGINE 25 MG PO TABS: 50.0000 mg | ORAL_TABLET | Freq: Every day | ORAL | 0 refills | Status: AC

## 2022-06-21 MED ORDER — PROPRANOLOL HCL 10 MG PO TABS: 10.0000 mg | ORAL_TABLET | Freq: Two times a day (BID) | ORAL | 0 refills | Status: AC

## 2022-06-21 MED ORDER — LITHIUM CARBONATE ER 300 MG PO TBCR: 600.0000 mg | EXTENDED_RELEASE_TABLET | Freq: Two times a day (BID) | ORAL | 0 refills | Status: AC

## 2022-06-21 MED ORDER — GABAPENTIN 400 MG PO CAPS: 400.0000 mg | ORAL_CAPSULE | Freq: Every day | ORAL | 0 refills | Status: AC

## 2022-06-21 MED ORDER — BUPROPION HCL ER (XL) 150 MG PO TB24: 150.0000 mg | ORAL_TABLET | Freq: Every day | ORAL | 0 refills | Status: AC

## 2022-06-21 NOTE — Progress Notes (Signed)
   06/21/22 0900  Psych Admission Type (Psych Patients Only)  Admission Status Voluntary  Psychosocial Assessment  Patient Complaints None  Eye Contact Fair  Facial Expression Flat  Affect Sad;Depressed  Speech Logical/coherent  Interaction Assertive  Motor Activity Slow  Appearance/Hygiene Unremarkable  Behavior Characteristics Cooperative;Appropriate to situation  Mood Depressed;Sad  Thought Process  Coherency WDL  Content WDL  Delusions None reported or observed  Perception WDL  Hallucination None reported or observed  Judgment WDL  Confusion None  Danger to Self  Current suicidal ideation? Denies  Danger to Others  Danger to Others None reported or observed

## 2022-06-21 NOTE — Progress Notes (Signed)
Patient discharged from [facility] on 06/21/22 at 1145. Patient denies SI, plan, and intention. Suicide safety plan completed, reviewed with this RN, given to the patient, and a copy in the chart. Patient denies HI/AVH upon discharge. Patient is alert, oriented, and cooperative. RN provided patient with discharge paperwork and reviewed information with patient. Patient expressed that she understood all of the discharge instructions. Pt was satisfied with belongings returned to her from the locker and at bedside. Discharged patient to [facility] waiting room.

## 2022-06-21 NOTE — Progress Notes (Signed)
  [facility] Adult Case Management Discharge Plan :  Will you be returning to the same living situation after discharge:  Yes,  with her sister At discharge, do you have transportation home?: Yes,  Pt will be picked up by stepfather Do you have the ability to pay for your medications: Yes,  Insured  Release of information consent forms completed and in the chart;  Patient's signature needed at discharge.  Patient to Follow up at:  Follow-up Information     Center, [facility]. Schedule an appointment as soon as possible for a visit.   Specialty: Behavioral Health Why: Med Mgnt on 06/29/2022  Therapy on 5/21 1300 Contact information: [address] [place] [address] [phone]                 Next level of care provider has access to [facility] Link:yes  Safety Planning and Suicide Prevention discussed: No.pt declined consent     Has patient been referred to the Quitline?: Patient does not use tobacco/nicotine products  Patient has been referred for addiction treatment: Yes, the patient will follow up with an outpatient provider for substance use disorder. Psychiatrist/APP: appointment made and Therapist: appointment made Patient to continue working towards treatment goals after discharge. Patient no longer meets criteria for inpatient criteria per attending physician. Continue taking medications as prescribed, nursing to provide instructions at discharge. Follow up with all scheduled appointments.   Marenda Accardi S Kamariah Fruchter, LCSW 06/21/2022, 9:37 AM

## 2022-06-21 NOTE — Discharge Summary (Signed)
Physician Discharge Summary Note  Patient:  Emma Holland is an 27 y.o., female MRN:  [id number] DOB:  1995-04-04 Patient phone:  [phone] (home)  Patient address:   [address] [place] [facility] [place] [address],  Total Time spent with patient: 45 minutes  Date of Admission:  06/14/2022 Date of Discharge: 06/21/2022  Reason for Admission:   The patient is a 27 year old Caucasian female was admitted on a voluntary status for symptoms of depression and suicidal ideations in the context of medication noncompliance.   Principal Problem: Bipolar 1 disorder, depressed, severe (HCC) Discharge Diagnoses: Principal Problem:   Bipolar 1 disorder, depressed, severe (HCC)   Past Psychiatric History:  History of diagnosed bipolar disorder and ADHD, history of outpatient treatment with psychiatrist for medication management as well as individual counseling.   Past Medical History: History reviewed. No pertinent past medical history. History reviewed. No pertinent surgical history.  Family History: History reviewed. No pertinent family history. Family Psychiatric  History:  No pertinent family history reported  Social History:  Social History   Substance and Sexual Activity  Alcohol Use Not Currently   Comment: prior alcohol binge use, sober since July 2023     Social History   Substance and Sexual Activity  Drug Use Never    Social History   Socioeconomic History   Marital status: Single    Spouse name: Not on file   Number of children: Not on file   Years of education: Not on file   Highest education level: Not on file  Occupational History   Not on file  Tobacco Use   Smoking status: Never   Smokeless tobacco: Not on file  Vaping Use   Vaping Use: Every day  Substance and Sexual Activity   Alcohol use: Not Currently    Comment: prior alcohol binge use, sober since July 2023   Drug use: Never   Sexual activity: Not on file  Other Topics Concern   Not on file  Social  History Narrative   Not on file   Social Determinants of Health   Financial Resource Strain: Not on file  Food Insecurity: Not on file  Transportation Needs: Not on file  Physical Activity: Not on file  Stress: Not on file  Social Connections: Not on file   Lives with boyfriend of 5 years, works at [employer], reports some work stressors, never married, no children, denies pending legal charges or court dates, denies access to weapons or guns at home   Substance Use History:  she denies any illicit drug use or alcohol prior to admission for at least 10 months prior to "drinking alcohol almost on a regular basis with history of being in rehab years ago   Hospital Course:   During the patient's hospitalization, patient had extensive initial psychiatric evaluation, and follow-up psychiatric evaluations every day.   Psychiatric diagnoses provided upon initial assessment: bipolar disorder, depressed severe   Patient's psychiatric medications were adjusted on admission:  Patient was restarted back on the following medications: Vraylar 6 mg a day Gabapentin 400 mg at night Lithium carbonate 300 mg 3 times a day Propranolol 20 mg twice a day   Increase lamotrigine to 50 mg a day   During the hospitalization, other adjustments were made to the patient's psychiatric medication regimen: Vraylar dose was adjusted to 4.5 mg daily to help with mood stabilization and depression, propranolol dose was adjusted to 10 mg twice daily for anxiety, gabapentin was continued 400 mg at bedtime to help  with anxiety and sleep, Lamictal was continued 50 mg daily for mood stabilization and depression, Wellbutrin XL was added 150 mg daily for depression, lithium carbonate dose was adjusted titrated up to 600 mg twice daily for mood stabilization and depression Lithium level was obtained on day of discharge therapeutic 0.96 with no toxicity noted.   Patient's care was discussed during the interdisciplinary team  meeting every day during the hospitalization.   The patient denied having side effects to prescribed psychiatric medication.   Gradually, patient started adjusting to milieu. The patient was evaluated each day by a clinical provider to ascertain response to treatment. Improvement was noted by the patient's report of decreasing symptoms, improved sleep and appetite, affect, medication tolerance, behavior, and participation in unit programming.  Patient was asked each day to complete a self inventory noting mood, mental status, pain, new symptoms, anxiety and concerns.     Symptoms were reported as significantly decreased or resolved completely by discharge.    On day of discharge, patient was evaluated on 06/21/2022 the patient reports that their mood is stable. The patient denied having suicidal thoughts for more than 48 hours prior to discharge.  Patient denies having homicidal thoughts.  Patient denies having auditory hallucinations.  Patient denies any visual hallucinations or other symptoms of psychosis. The patient was motivated to continue taking medication with a goal of continued improvement in mental health.    The patient reports their target psychiatric symptoms of depression, mood instability, SI responded well to the psychiatric medications, and the patient reports overall benefit other psychiatric hospitalization. Supportive psychotherapy was provided to the patient. The patient also participated in regular group therapy while hospitalized. Coping skills, problem solving as well as relaxation therapies were also part of the unit programming. On day of discharge patient is able to discuss coping skills with the stressors as well as crisis plan if worsening depression or recurring SI after discharge   Labs were reviewed with the patient, and abnormal results were discussed with the patient.   The patient is able to verbalize their individual safety plan to this provider.   Behavioral  Events: None   Restraints: None   Groups: Attended and participated   Medications Changes: As above   Sleep  Sleep: Improved during hospital stay, patient reported some interrupted sleep the night prior to discharge mainly related to being in the hospital and feeling anxious about discharge.  Physical Findings: AIMS: Facial and Oral Movements Muscles of Facial Expression: None, normal Lips and Perioral Area: None, normal Jaw: None, normal Tongue: None, normal,Extremity Movements Upper (arms, wrists, hands, fingers): None, normal Lower (legs, knees, ankles, toes): None, normal, Trunk Movements Neck, shoulders, hips: None, normal, Overall Severity Severity of abnormal movements (highest score from questions above): None, normal Incapacitation due to abnormal movements: None, normal Patient's awareness of abnormal movements (rate only patient's report): No Awareness, Dental Status Current problems with teeth and/or dentures?: No Does patient usually wear dentures?: No  CIWA:    COWS:     Musculoskeletal: Strength & Muscle Tone: within normal limits Gait & Station: normal Patient leans: N/A   Psychiatric Specialty Exam:  General Appearance: appears at stated age, fairly dressed and groomed  Behavior: pleasant and cooperative  Psychomotor Activity:No psychomotor agitation or retardation noted   Eye Contact: good Speech: normal amount, tone, volume and latency   Mood: euthymic Affect: congruent, pleasant and interactive  Thought Process: linear, goal directed, no circumstantial or tangential thought process noted, no racing thoughts or  flight of ideas Descriptions of Associations: intact Thought Content: Hallucinations: denies AH, VH , does not appear responding to stimuli Delusions: No paranoia or other delusions noted Suicidal Thoughts: denies SI, intention, plan  Homicidal Thoughts: denies HI, intention, plan   Alertness/Orientation: alert and fully  oriented  Insight: fair, improved Judgment: fair, improved  Memory: intact  Executive Functions  Concentration: intact  Attention Span: Fair Recall: intact Fund of Knowledge: fair   Assets  Assets: Desire for Improvement; Manufacturing systems engineer; Housing    Physical Exam:  Physical Exam Vitals and nursing note reviewed.  Constitutional:      Appearance: Normal appearance.  HENT:     Head: Normocephalic and atraumatic.     Nose: Nose normal.  Eyes:     Pupils: Pupils are equal, round, and reactive to light.  Pulmonary:     Effort: Pulmonary effort is normal.  Musculoskeletal:        General: Normal range of motion.     Cervical back: Normal range of motion.  Neurological:     General: No focal deficit present.     Mental Status: She is alert and oriented to person, place, and time.  Psychiatric:        Mood and Affect: Mood normal.        Behavior: Behavior normal.        Thought Content: Thought content normal.        Judgment: Judgment normal.    Review of Systems  All other systems reviewed and are negative.  Blood pressure 110/78, pulse 76, temperature 98.6 F (37 C), temperature source Oral, resp. rate 20, height 5\' 3"  (1.6 m), weight 64.4 kg, last menstrual period 05/30/2022, SpO2 100 %. Body mass index is 25.15 kg/m.   Social History   Tobacco Use  Smoking Status Never  Smokeless Tobacco Not on file   Tobacco Cessation:  N/A, patient does not currently use tobacco products   Blood Alcohol level:  Lab Results  Component Value Date   ETH <10 06/14/2022   ETH <10 06/23/2021    Metabolic Disorder Labs:  Lab Results  Component Value Date   HGBA1C 4.8 06/23/2021   MPG 91.06 06/23/2021   No results found for: "PROLACTIN" Lab Results  Component Value Date   CHOL 205 (H) 06/23/2021   TRIG 67 06/23/2021   HDL 72 06/23/2021   CHOLHDL 2.8 06/23/2021   VLDL 13 06/23/2021   LDLCALC 120 (H) 06/23/2021    See Psychiatric Specialty Exam and  Suicide Risk Assessment completed by Attending Physician prior to discharge.  Discharge destination:  Home  Is patient on multiple antipsychotic therapies at discharge:  No   Has Patient had three or more failed trials of antipsychotic monotherapy by history:  No  Recommended Plan for Multiple Antipsychotic Therapies: NA  Discharge Instructions     Diet - low sodium heart healthy   Complete by: As directed    Increase activity slowly   Complete by: As directed       Allergies as of 06/21/2022   No Known Allergies      Medication List     STOP taking these medications    lithium 300 MG tablet Replaced by: lithium carbonate 300 MG ER tablet       TAKE these medications      Indication  buPROPion 150 MG 24 hr tablet Commonly known as: WELLBUTRIN XL Take 1 tablet (150 mg total) by mouth daily.  Indication: depression   Cariprazine HCl  4.5 MG Caps Take 1 capsule (4.5 mg total) by mouth daily. What changed:  how much to take when to take this  Indication: MIXED BIPOLAR AFFECTIVE DISORDER   etonogestrel-ethinyl estradiol 0.12-0.015 MG/24HR vaginal ring Commonly known as: NUVARING Place 1 each vaginally every 28 (twenty-eight) days.  Indication: Birth Control Treatment   gabapentin 400 MG capsule Commonly known as: NEURONTIN Take 1 capsule (400 mg total) by mouth at bedtime. What changed:  medication strength how much to take when to take this reasons to take this  Indication: Social Anxiety Disorder   lamoTRIgine 25 MG tablet Commonly known as: LAMICTAL Take 2 tablets (50 mg total) by mouth daily. What changed: how much to take  Indication: Manic-Depression   lithium carbonate 300 MG ER tablet Commonly known as: LITHOBID Take 2 tablets (600 mg total) by mouth every 12 (twelve) hours. Replaces: lithium 300 MG tablet  Indication: Manic-Depression   propranolol 10 MG tablet Commonly known as: INDERAL Take 1 tablet (10 mg total) by mouth 2 (two)  times daily. What changed:  medication strength how much to take  Indication: Feeling Anxious   traZODone 50 MG tablet Commonly known as: DESYREL Take 1 tablet (50 mg total) by mouth at bedtime as needed for sleep.  Indication: Trouble Sleeping        Follow-up Information     [facility]. Schedule an appointment as soon as possible for a visit.   Specialty: Behavioral Health Why: Med Mgnt on 06/29/2022  Therapy on 5/21 1300 Contact information: [address] [place] [address] [phone]                 Discharge recommendations:   Activity: as tolerated  Diet: heart healthy  # It is recommended to the patient to continue psychiatric medications as prescribed, after discharge from the hospital.     # It is recommended to the patient to follow up with your outpatient psychiatric provider and PCP.   # It was discussed with the patient, the impact of alcohol, drugs, tobacco have been there overall psychiatric and medical wellbeing, and total abstinence from substance use was recommended the patient.ed.   # Prescriptions provided or sent directly to preferred pharmacy at discharge. Patient agreeable to plan. Given opportunity to ask questions. Appears to feel comfortable with discharge.    # In the event of worsening symptoms, the patient is instructed to call the crisis hotline, 911 and or go to the nearest ED for appropriate evaluation and treatment of symptoms. To follow-up with primary care provider for other medical issues, concerns and or health care needs   # Patient was discharged home with a plan to follow up as noted above.   Patient agrees with D/C instructions and plan.   The patient received suicide prevention pamphlet:  Yes Belongings returned:  Clothing and Valuables  Total Time Spent in Direct Patient Care:  I personally spent 45 minutes on the unit in direct patient care. The direct patient care  time included face-to-face time with the patient, reviewing the patient's chart, communicating with other professionals, and coordinating care. Greater than 50% of this time was spent in counseling or coordinating care with the patient regarding goals of hospitalization, psycho-education, and discharge planning needs.    SignedSarita Bottom, MD 06/21/2022, 9:55 AM

## 2022-06-21 NOTE — [facility] Suicide Risk Assessment (Signed)
[facility] Discharge Suicide Risk Assessment   Principal Problem: MDD (major depressive disorder) Discharge Diagnoses: Principal Problem:   MDD (major depressive disorder)   Total Time spent with patient: 45 minutes  Reason for admission: The patient is a 27 year old Caucasian female was admitted on a voluntary status for symptoms of depression and suicidal ideations in the context of medication noncompliance.   PTA Medications:  Medications Prior to Admission  Medication Sig Dispense Refill Last Dose   etonogestrel-ethinyl estradiol (NUVARING) 0.12-0.015 MG/24HR vaginal ring Place 1 each vaginally every 28 (twenty-eight) days.         gabapentin (NEURONTIN) 300 MG capsule Take 300-600 mg by mouth at bedtime as needed.         lamoTRIgine (LAMICTAL) 25 MG tablet Take 25 mg by mouth daily.         lithium 300 MG tablet Take 900 mg by mouth at bedtime.         propranolol (INDERAL) 20 MG tablet Take 20 mg by mouth 2 (two) times daily.         VRAYLAR 4.5 MG CAPS Take 1 capsule by mouth at bedtime.          Hospital Course:   During the patient's hospitalization, patient had extensive initial psychiatric evaluation, and follow-up psychiatric evaluations every day.  Psychiatric diagnoses provided upon initial assessment: bipolar disorder, depressed severe  Patient's psychiatric medications were adjusted on admission:  Patient was restarted back on the following medications: Vraylar 6 mg a day Gabapentin 400 mg at night Lithium carbonate 300 mg 3 times a day Propranolol 20 mg twice a day   Increase lamotrigine to 50 mg a day  During the hospitalization, other adjustments were made to the patient's psychiatric medication regimen: Vraylar dose was adjusted to 4.5 mg daily to help with mood stabilization and depression, propranolol dose was adjusted to 10 mg twice daily for anxiety, gabapentin was continued 400 mg at bedtime to help with anxiety and sleep, Lamictal was continued 50 mg daily  for mood stabilization and depression, Wellbutrin XL was added 150 mg daily for depression, lithium carbonate dose was adjusted titrated up to 600 mg twice daily for mood stabilization and depression Lithium level was obtained on day of discharge therapeutic 0.96 with no toxicity noted.  Patient's care was discussed during the interdisciplinary team meeting every day during the hospitalization.  The patient denied having side effects to prescribed psychiatric medication.  Gradually, patient started adjusting to milieu. The patient was evaluated each day by a clinical provider to ascertain response to treatment. Improvement was noted by the patient's report of decreasing symptoms, improved sleep and appetite, affect, medication tolerance, behavior, and participation in unit programming.  Patient was asked each day to complete a self inventory noting mood, mental status, pain, new symptoms, anxiety and concerns.    Symptoms were reported as significantly decreased or resolved completely by discharge.   On day of discharge, patient was evaluated on 06/21/2022 the patient reports that their mood is stable. The patient denied having suicidal thoughts for more than 48 hours prior to discharge.  Patient denies having homicidal thoughts.  Patient denies having auditory hallucinations.  Patient denies any visual hallucinations or other symptoms of psychosis. The patient was motivated to continue taking medication with a goal of continued improvement in mental health.   The patient reports their target psychiatric symptoms of depression, mood instability, SI responded well to the psychiatric medications, and the patient reports overall benefit other psychiatric hospitalization. Supportive  psychotherapy was provided to the patient. The patient also participated in regular group therapy while hospitalized. Coping skills, problem solving as well as relaxation therapies were also part of the unit programming. On day  of discharge patient is able to discuss coping skills with the stressors as well as crisis plan if worsening depression or recurring SI after discharge  Labs were reviewed with the patient, and abnormal results were discussed with the patient.  The patient is able to verbalize their individual safety plan to this provider.  Behavioral Events: None  Restraints: None  Groups: Attended and participated  Medications Changes: As above  Sleep  Sleep: Improved during hospital stay, patient reported some interrupted sleep the night prior to discharge mainly related to being in the hospital and feeling anxious about discharge.  Musculoskeletal: Strength & Muscle Tone: within normal limits Gait & Station: normal Patient leans: N/A  Psychiatric Specialty Exam  General Appearance: appears at stated age, fairly dressed and groomed  Behavior: pleasant and cooperative  Psychomotor Activity:No psychomotor agitation or retardation noted   Eye Contact: good Speech: normal amount, tone, volume and latency   Mood: euthymic Affect: congruent, pleasant and interactive  Thought Process: linear, goal directed, no circumstantial or tangential thought process noted, no racing thoughts or flight of ideas Descriptions of Associations: intact Thought Content: Hallucinations: denies AH, VH , does not appear responding to stimuli Delusions: No paranoia or other delusions noted Suicidal Thoughts: denies SI, intention, plan  Homicidal Thoughts: denies HI, intention, plan   Alertness/Orientation: alert and fully oriented  Insight: fair, improved Judgment: fair, improved  Memory: intact  Executive Functions  Concentration: intact  Attention Span: Fair Recall: intact Fund of Knowledge: fair   Art therapist  Concentration: intact Attention Span: Fair Recall: intact Fund of Knowledge: fair   Assets  Assets: Desire for Improvement; Manufacturing systems engineer; Housing   Physical  Exam: Physical Exam ROS Blood pressure 110/78, pulse 76, temperature 98.6 F (37 C), temperature source Oral, resp. rate 20, height 5\' 3"  (1.6 m), weight 64.4 kg, last menstrual period 05/30/2022, SpO2 100 %. Body mass index is 25.15 kg/m.  Mental Status Per Nursing Assessment::   On Admission:  Self-harm thoughts (H/O self harm behavior)  Demographic Factors:  Caucasian  Loss Factors: NA  Historical Factors: NA  Risk Reduction Factors:   Employed, Positive social support, and Positive coping skills or problem solving skills  Continued Clinical Symptoms: Symptoms improved significantly during hospital stay Bipolar Disorder:   Depressive phase  Cognitive Features That Contribute To Risk:  None    Suicide Risk:  Minimal: No identifiable suicidal ideation.  Patients presenting with no risk factors but with morbid ruminations; may be classified as minimal risk based on the severity of the depressive symptoms   Follow-up Information     [facility]. Schedule an appointment as soon as possible for a visit.   Specialty: Behavioral Health Why: Med Mgnt on 06/29/2022  Therapy on 5/21 1300 Contact information: [address] [place] [address] [phone]                 Plan Of Care/Follow-up recommendations:   Discharge recommendations:    Activity: as tolerated  Diet: heart healthy  # It is recommended to the patient to continue psychiatric medications as prescribed, after discharge from the hospital.     # It is recommended to the patient to follow up with your outpatient psychiatric provider and PCP.   # It  was discussed with the patient, the impact of alcohol, drugs, tobacco have been there overall psychiatric and medical wellbeing, and total abstinence from substance use was recommended the patient.ed.   # Prescriptions provided or sent directly to preferred pharmacy at discharge. Patient agreeable to plan.  Given opportunity to ask questions. Appears to feel comfortable with discharge.    # In the event of worsening symptoms, the patient is instructed to call the crisis hotline, 911 and or go to the nearest ED for appropriate evaluation and treatment of symptoms. To follow-up with primary care provider for other medical issues, concerns and or health care needs   # Patient was discharged home with a plan to follow up as noted above.   Patient agrees with D/C instructions and plan.  The patient received suicide prevention pamphlet:  Yes Belongings returned:  Clothing and Valuables  Total Time Spent in Direct Patient Care:  I personally spent 45 minutes on the unit in direct patient care. The direct patient care time included face-to-face time with the patient, reviewing the patient's chart, communicating with other professionals, and coordinating care. Greater than 50% of this time was spent in counseling or coordinating care with the patient regarding goals of hospitalization, psycho-education, and discharge planning needs.   Kable Haywood 06/21/2022, 9:47 AM   Adaiah Morken Abbott Pao, MD 06/21/2022, 9:47 AM

## 2022-06-21 NOTE — BH IP Treatment Plan (Signed)
Interdisciplinary Treatment and Diagnostic Plan Update  06/21/2022 Time of Session: 8:25 AM ( UPDATE )  Emma Holland MRN: [id number]  Principal Diagnosis: MDD (major depressive disorder)  Secondary Diagnoses: Principal Problem:   MDD (major depressive disorder)   Current Medications:  Current Facility-Administered Medications  Medication Dose Route Frequency Provider Last Rate Last Admin   acetaminophen (TYLENOL) tablet 650 mg  650 mg Oral Q6H PRN Oneta Rack, NP       alum & mag hydroxide-simeth (MAALOX/MYLANTA) 200-200-20 MG/5ML suspension 30 mL  30 mL Oral Q4H PRN Oneta Rack, NP       buPROPion (WELLBUTRIN XL) 24 hr tablet 150 mg  150 mg Oral Daily Rex Kras, MD   150 mg at 06/21/22 0758   cariprazine (VRAYLAR) capsule 4.5 mg  4.5 mg Oral Daily Goli, Devainder, MD   4.5 mg at 06/20/22 2105   gabapentin (NEURONTIN) capsule 400 mg  400 mg Oral QHS Rex Kras, MD   400 mg at 06/20/22 2105   lamoTRIgine (LAMICTAL) tablet 50 mg  50 mg Oral Daily Rex Kras, MD   50 mg at 06/21/22 0758   lithium carbonate (LITHOBID) ER tablet 600 mg  600 mg Oral Q12H Rex Kras, MD   600 mg at 06/21/22 0758   ziprasidone (GEODON) injection 20 mg  20 mg Intramuscular Q12H PRN Oneta Rack, NP       And   LORazepam (ATIVAN) tablet 1 mg  1 mg Oral PRN Oneta Rack, NP       magnesium hydroxide (MILK OF MAGNESIA) suspension 30 mL  30 mL Oral Daily PRN Oneta Rack, NP       propranolol (INDERAL) tablet 10 mg  10 mg Oral BID Rex Kras, MD   10 mg at 06/21/22 0758   traZODone (DESYREL) tablet 50 mg  50 mg Oral QHS PRN Oneta Rack, NP   50 mg at 06/20/22 2254   PTA Medications: Medications Prior to Admission  Medication Sig Dispense Refill Last Dose   etonogestrel-ethinyl estradiol (NUVARING) 0.12-0.015 MG/24HR vaginal ring Place 1 each vaginally every 28 (twenty-eight) days.      gabapentin (NEURONTIN) 300 MG capsule Take 300-600 mg by mouth at bedtime  as needed.      lamoTRIgine (LAMICTAL) 25 MG tablet Take 25 mg by mouth daily.      lithium 300 MG tablet Take 900 mg by mouth at bedtime.      propranolol (INDERAL) 20 MG tablet Take 20 mg by mouth 2 (two) times daily.      VRAYLAR 4.5 MG CAPS Take 1 capsule by mouth at bedtime.       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   Physician Treatment Plan for Primary Diagnosis: MDD (major depressive disorder) Long Term Goal(s): Improvement in symptoms so as ready for discharge   Short Term Goals: Ability to identify changes in lifestyle to reduce recurrence of condition will improve Ability to verbalize feelings will improve Ability to disclose and discuss suicidal ideas Ability to demonstrate self-control will improve Ability to identify and develop effective coping behaviors will improve  Medication Management: Evaluate patient's response, side effects, and tolerance of medication regimen.  Therapeutic Interventions: 1 to 1  sessions, Unit Group sessions and Medication administration.  Evaluation of Outcomes: Adequate for Discharge  Physician Treatment Plan for Secondary Diagnosis: Principal Problem:   MDD (major depressive disorder)  Long Term Goal(s): Improvement in symptoms so as ready for discharge   Short Term Goals: Ability to identify changes in lifestyle to reduce recurrence of condition will improve Ability to verbalize feelings will improve Ability to disclose and discuss suicidal ideas Ability to demonstrate self-control will improve Ability to identify and develop effective coping behaviors will improve     Medication Management: Evaluate patient's  response, side effects, and tolerance of medication regimen.  Therapeutic Interventions: 1 to 1 sessions, Unit Group sessions and Medication administration.  Evaluation of Outcomes: Adequate for Discharge   RN Treatment Plan for Primary Diagnosis: MDD (major depressive disorder) Long Term Goal(s): Knowledge of disease and therapeutic regimen to maintain health will improve  Short Term Goals: Ability to remain free from injury will improve, Ability to verbalize frustration and anger appropriately will improve, Ability to participate in decision making will improve, Ability to verbalize feelings will improve, Ability to identify and develop effective coping behaviors will improve, and Compliance with prescribed medications will improve  Medication Management: RN will administer medications as ordered by provider, will assess and evaluate patient's response and provide education to patient for prescribed medication. RN will report any adverse and/or side effects to prescribing provider.  Therapeutic Interventions: 1 on 1 counseling sessions, Psychoeducation, Medication administration, Evaluate responses to treatment, Monitor vital signs and CBGs as ordered, Perform/monitor CIWA, COWS, AIMS and Fall Risk screenings as ordered, Perform wound care treatments as ordered.  Evaluation of Outcomes: Adequate for Discharge   LCSW Treatment Plan for Primary Diagnosis: MDD (major depressive disorder) Long Term Goal(s): Safe transition to appropriate next level of care at discharge, Engage patient in therapeutic group addressing interpersonal concerns.  Short Term Goals: Engage patient in aftercare planning with referrals and resources, Increase social support, Increase emotional regulation, Facilitate acceptance of mental health diagnosis and concerns, Identify triggers associated with mental health/substance abuse issues, and Increase skills for wellness and recovery  Therapeutic Interventions: Assess for  all discharge needs, 1 to 1 time with Social worker, Explore available resources and support systems, Assess for adequacy in community support network, Educate family and significant other(s) on suicide prevention, Complete Psychosocial Assessment, Interpersonal group therapy.  Evaluation of Outcomes: Adequate for Discharge  Progress in Treatment: Attending groups: Yes. Participating in groups: Yes. Taking medication as prescribed: Yes. Toleration medication: Yes. Family/Significant other contact made: No, will contact:  Pt declined consents Patient understands diagnosis: Yes. Discussing patient identified problems/goals with staff: Yes. Medical problems stabilized or resolved: Yes. Denies suicidal/homicidal ideation: Yes Issues/concerns per patient self-inventory: No.    New problem(s) identified: No, Describe:  None Known   New Short Term/Long Term Goal(s): medication stabilization, elimination of SI thoughts, development of comprehensive mental wellness plan.    Patient Goals:  Coping Skills   Discharge Plan or Barriers:  Reason for Continuation of Hospitalization: : Patient recently admitted. CSW will continue to follow and assess for appropriate referrals and possible discharge planning.   Depression Medication stabilization Suicidal ideation   Estimated Length of Stay: Adequate for Discharge     Last 3 [place] Suicide Severity Risk Score: Flowsheet Row Ad[facility] (Current) [facility] Most recent reading at 06/14/2022  7:00 PM ED from 06/14/2022 in [facility] Most recent reading at 06/14/2022 12:34 AM  ED from 05/22/2022 in [facility] Most recent reading at 05/22/2022 10:14 PM  C-SSRS RISK CATEGORY High Risk Moderate Risk No Risk       Last PHQ 2/9 Scores:     No data to display          Scribe for Treatment Team: Beather Arbour 06/21/2022 8:34 AM

## 2022-10-04 ENCOUNTER — Other Ambulatory Visit: Payer: Self-pay | Admitting: Family Medicine

## 2022-10-04 DIAGNOSIS — N6320 Unspecified lump in the left breast, unspecified quadrant: Secondary | ICD-10-CM

## 2022-10-20 ENCOUNTER — Ambulatory Visit
Admission: RE | Admit: 2022-10-20 | Discharge: 2022-10-20 | Disposition: A | Discharge status: Home / Self Care | Payer: 59 | Source: Ambulatory Visit | Attending: Family Medicine | Admitting: Family Medicine

## 2022-10-20 DIAGNOSIS — N6320 Unspecified lump in the left breast, unspecified quadrant: Secondary | ICD-10-CM

## 2023-05-05 ENCOUNTER — Encounter (HOSPITAL_BASED_OUTPATIENT_CLINIC_OR_DEPARTMENT_OTHER): Payer: Self-pay

## 2023-05-05 ENCOUNTER — Emergency Department (HOSPITAL_BASED_OUTPATIENT_CLINIC_OR_DEPARTMENT_OTHER)

## 2023-05-05 ENCOUNTER — Other Ambulatory Visit: Payer: Self-pay

## 2023-05-05 ENCOUNTER — Emergency Department (HOSPITAL_BASED_OUTPATIENT_CLINIC_OR_DEPARTMENT_OTHER)
Admission: EM | Admit: 2023-05-05 | Discharge: 2023-05-05 | Disposition: A | Discharge status: Home / Self Care | Attending: Emergency Medicine | Admitting: Emergency Medicine

## 2023-05-05 DIAGNOSIS — M25561 Pain in right knee: Secondary | ICD-10-CM | POA: Diagnosis present

## 2023-05-05 DIAGNOSIS — L03115 Cellulitis of right lower limb: Secondary | ICD-10-CM | POA: Insufficient documentation

## 2023-05-05 MED ORDER — DOXYCYCLINE HYCLATE 100 MG PO TABS
100.0000 mg | ORAL_TABLET | Freq: Once | ORAL | Status: AC
  Administered 2023-05-05: 100 mg via ORAL
  Filled 2023-05-05: qty 1

## 2023-05-05 MED ORDER — DOXYCYCLINE HYCLATE 100 MG PO CAPS: 100.0000 mg | ORAL_CAPSULE | Freq: Two times a day (BID) | ORAL | 0 refills | Status: AC

## 2023-05-05 NOTE — ED Provider Notes (Signed)
 [place] EMERGENCY DEPARTMENT AT [facility] Provider Note   CSN: [id number] Arrival date & time: 05/05/23  2109    History  Chief Complaint  Patient presents with   Knee Pain    Emma Holland is a 28 y.o. female here for evaluation of knee pain. Started 3 days ago. Seen by UC yesterday started on Keflex. Sx not worse however not improved either. Able to ambulate.  No pain with range of motion to the leg.  No numbness, weakness.  Did note an abrasion over the knee however unsure how they got this.  Denies any known trauma or injury.  No fever, chills.  HPI     Home Medications Prior to Admission medications   Medication Sig Start Date End Date Taking? Authorizing Provider  buPROPion (WELLBUTRIN XL) 150 MG 24 hr tablet Take 1 tablet (150 mg total) by mouth daily. 06/21/22   Sarita Bottom, MD  cariprazine 4.5 MG CAPS Take 1 capsule (4.5 mg total) by mouth daily. 06/21/22   Sarita Bottom, MD  etonogestrel-ethinyl estradiol (NUVARING) 0.12-0.015 MG/24HR vaginal ring Place 1 each vaginally every 28 (twenty-eight) days. 10/10/21   [provider]  gabapentin (NEURONTIN) 400 MG capsule Take 1 capsule (400 mg total) by mouth at bedtime. 06/21/22   Sarita Bottom, MD  lamoTRIgine (LAMICTAL) 25 MG tablet Take 2 tablets (50 mg total) by mouth daily. 06/21/22   Sarita Bottom, MD  lithium carbonate (LITHOBID) 300 MG ER tablet Take 2 tablets (600 mg total) by mouth every 12 (twelve) hours. 06/21/22   Sarita Bottom, MD  propranolol (INDERAL) 10 MG tablet Take 1 tablet (10 mg total) by mouth 2 (two) times daily. 06/21/22   Sarita Bottom, MD  traZODone (DESYREL) 50 MG tablet Take 1 tablet (50 mg total) by mouth at bedtime as needed for sleep. 06/21/22   Sarita Bottom, MD      Allergies    Patient has no known allergies.    Review of Systems   Review of Systems  Constitutional: Negative.   HENT: Negative.    Respiratory: Negative.    Cardiovascular: Negative.   Gastrointestinal:  Negative.   Genitourinary: Negative.   Musculoskeletal:        Right knee pain  Neurological: Negative.   All other systems reviewed and are negative.   Physical Exam Updated Vital Signs BP (!) 128/91   Pulse 87   Temp 98.2 F (36.8 C) (Oral)   Resp 16   Ht 5\' 2"  (1.575 m)   Wt 68 kg   SpO2 100%   BMI 27.44 kg/m  Physical Exam Vitals and nursing note reviewed.  Constitutional:      General: She is not in acute distress.    Appearance: She is well-developed. She is not ill-appearing, toxic-appearing or diaphoretic.  HENT:     Head: Atraumatic.  Eyes:     Pupils: Pupils are equal, round, and reactive to light.  Cardiovascular:     Rate and Rhythm: Normal rate.     Pulses:          Dorsalis pedis pulses are 2+ on the right side.       Posterior tibial pulses are 2+ on the right side.  Pulmonary:     Effort: No respiratory distress.  Abdominal:     General: There is no distension.  Musculoskeletal:        General: Normal range of motion.     Cervical back: Normal range of motion.  Comments: No bony tenderness, compartments soft, no obvious joint effusion.  Able to flex and extend without difficulty.  Skin:    General: Skin is warm and dry.  Neurological:     General: No focal deficit present.     Mental Status: She is alert.  Psychiatric:        Mood and Affect: Mood normal.     ED Results / Procedures / Treatments   Labs (all labs ordered are listed, but only abnormal results are displayed) Labs Reviewed - No data to display  EKG None  Radiology No results found.  Procedures .Ultrasound ED Soft Tissue  Date/Time: 05/05/2023 10:19 PM  Performed by: Linwood Dibbles, PA-C Authorized by: Linwood Dibbles, PA-C   Procedure details:    Indications: localization of abscess and evaluate for cellulitis     Transverse view:  Visualized   Longitudinal view:  Visualized   Images: archived   Location:    Location: lower extremity     Side:   Right Findings:     no abscess present    cellulitis present    no foreign body present     Medications Ordered in ED Medications  doxycycline (VIBRA-TABS) tablet 100 mg (has no administration in time range)    ED Course/ Medical Decision Making/ A&P    Here for right knee pain. Atraumatic. Seen by UC yesterday given Keflex. Not worsening however not improved either. NV intact.  Has some erythema overlying anterior mpart of the knee, no fluctuance or induration.  Did bedside ultrasound with no drainable fluid collection.  She has full range of motion, I have low suspicion for septic joint.  I did want to get an x-ray however patient declined.  We discussed risk versus benefit.  Declines x-ray at this time.  Will change antibiotics to doxycycline.  No history of MRSA.  Will have her follow-up outpatient, return for any worsening symptoms.  The patient has been appropriately medically screened and/or stabilized in the ED. I have low suspicion for any other emergent medical condition which would require further screening, evaluation or treatment in the ED or require inpatient management.  Patient is hemodynamically stable and in no acute distress.  Patient able to ambulate in department prior to ED.  Evaluation does not show acute pathology that would require ongoing or additional emergent interventions while in the emergency department or further inpatient treatment.  I have discussed the diagnosis with the patient and answered all questions.  Pain is been managed while in the emergency department and patient has no further complaints prior to discharge.  Patient is comfortable with plan discussed in room and is stable for discharge at this time.  I have discussed strict return precautions for returning to the emergency department.  Patient was encouraged to follow-up with PCP/specialist refer to at discharge.                                  Medical Decision Making Amount and/or Complexity  of Data Reviewed External Data Reviewed: radiology and notes. Radiology: ordered and independent interpretation performed. Decision-making details documented in ED Course.  Risk OTC drugs. Prescription drug management. Decision regarding hospitalization. Diagnosis or treatment significantly limited by social determinants of health.          Final Clinical Impression(s) / ED Diagnoses Final diagnoses:  Cellulitis of right lower extremity    Rx / DC Orders ED Discharge  Orders     None         Carrell Palmatier A, PA-C 05/05/23 2221    Anders Simmonds T, DO 05/06/23 [phone]

## 2023-05-05 NOTE — ED Notes (Signed)
 RN reviewed discharge instructions with pt. Pt verbalized understanding and had no further questions. VSS upon discharge.

## 2023-05-05 NOTE — Discharge Instructions (Signed)
 I have started on doxycycline.  Take with meal and drink 1 glass of water after taking the medication as it can cause stomach upset.  Warm compress to the knee  Follow-up with primary care provider or orthopedic office  Return for any worsening symptoms

## 2023-05-05 NOTE — ED Triage Notes (Signed)
 Pt presents via POV c/o right knee infection. Reports started on abx on Thursday for cellulitis but reports minimal improvement, however denies worsening symptoms. Ambulated independently to treatment room. A&O x4.

## 2023-06-06 ENCOUNTER — Emergency Department (HOSPITAL_BASED_OUTPATIENT_CLINIC_OR_DEPARTMENT_OTHER)
Admission: EM | Admit: 2023-06-06 | Discharge: 2023-06-07 | Disposition: A | Discharge status: Home / Self Care | Attending: Emergency Medicine | Admitting: Emergency Medicine

## 2023-06-06 ENCOUNTER — Other Ambulatory Visit: Payer: Self-pay

## 2023-06-06 DIAGNOSIS — M79605 Pain in left leg: Secondary | ICD-10-CM | POA: Diagnosis present

## 2023-06-06 NOTE — ED Triage Notes (Signed)
 Pt POV reporting increased L leg swelling, recent sprained ankle + foot frx, currently in boot, reports numbness in toes, possibly too tight, loosened in triage with improvement.

## 2023-06-07 NOTE — ED Provider Notes (Signed)
 [place] EMERGENCY DEPARTMENT AT [facility] Provider Note   CSN: [id number] Arrival date & time: 06/06/23  2337     History  Chief Complaint  Patient presents with   Leg Swelling    Emma Holland is a 28 y.o. female.  Patient is a 28 year old female presenting with complaints of irritation to her left leg.  She apparently inverted her ankle 2 weeks ago and was seen at an outside facility 2 days ago.  She was found to have a fracture of her ankle, then was referred to orthopedics.  She has this appointment tomorrow.  This evening the boot she was placed in seems to be irritating her leg and making it feel somewhat numb.       Home Medications Prior to Admission medications   Medication Sig Start Date End Date Taking? Authorizing Provider  buPROPion  (WELLBUTRIN  XL) 150 MG 24 hr tablet Take 1 tablet (150 mg total) by mouth daily. 06/21/22   Alver Jobs, MD  cariprazine  4.5 MG CAPS Take 1 capsule (4.5 mg total) by mouth daily. 06/21/22   Alver Jobs, MD  doxycycline  (VIBRAMYCIN ) 100 MG capsule Take 1 capsule (100 mg total) by mouth 2 (two) times daily. 05/05/23   Henderly, Britni A, PA-C  etonogestrel-ethinyl estradiol (NUVARING) 0.12-0.015 MG/24HR vaginal ring Place 1 each vaginally every 28 (twenty-eight) days. 10/10/21   [provider]  gabapentin  (NEURONTIN ) 400 MG capsule Take 1 capsule (400 mg total) by mouth at bedtime. 06/21/22   Alver Jobs, MD  lamoTRIgine  (LAMICTAL ) 25 MG tablet Take 2 tablets (50 mg total) by mouth daily. 06/21/22   Alver Jobs, MD  lithium  carbonate (LITHOBID ) 300 MG ER tablet Take 2 tablets (600 mg total) by mouth every 12 (twelve) hours. 06/21/22   Alver Jobs, MD  propranolol  (INDERAL ) 10 MG tablet Take 1 tablet (10 mg total) by mouth 2 (two) times daily. 06/21/22   Alver Jobs, MD  traZODone  (DESYREL ) 50 MG tablet Take 1 tablet (50 mg total) by mouth at bedtime as needed for sleep. 06/21/22   Alver Jobs, MD       Allergies    Patient has no known allergies.    Review of Systems   Review of Systems  All other systems reviewed and are negative.   Physical Exam Updated Vital Signs BP 112/69   Pulse 61   Resp 18   Ht 5\' 3"  (1.6 m)   Wt 61.2 kg   SpO2 99%   BMI 23.91 kg/m  Physical Exam Vitals and nursing note reviewed.  Constitutional:      Appearance: Normal appearance.  Pulmonary:     Effort: Pulmonary effort is normal.  Musculoskeletal:     Comments: Patient is in a cam walker type device.  This was removed and lower extremity is normal in appearance.  DP pulses are palpable and motor and sensation are intact throughout the entire foot.  There is no calf tenderness.  Skin:    General: Skin is warm and dry.  Neurological:     Mental Status: She is alert and oriented to person, place, and time.     ED Results / Procedures / Treatments   Labs (all labs ordered are listed, but only abnormal results are displayed) Labs Reviewed - No data to display  EKG None  Radiology No results found.  Procedures Procedures    Medications Ordered in ED Medications - No data to display  ED Course/ Medical Decision Making/ A&P  Patient presenting with  irritation to her leg from what appears to be a malfitting walking boot.  The boot was loosened and this seems to make the symptoms better.  Boot will be repositioned and patient advised to follow-up with orthopedics tomorrow as scheduled.  Final Clinical Impression(s) / ED Diagnoses Final diagnoses:  None    Rx / DC Orders ED Discharge Orders     None         Orvilla Blander, MD 06/07/23 0100

## 2023-06-07 NOTE — Discharge Instructions (Signed)
 Follow-up with orthopedics as scheduled tomorrow.

## 2023-06-07 NOTE — ED Notes (Signed)
 RN reviewed discharge instructions with pt. Pt verbalized understanding and had no further questions. VSS upon discharge.
